# Patient Record
Sex: Female | Born: 1985 | Race: Black or African American | Hispanic: No | Marital: Single | Smoking: Current every day smoker
Health system: Southern US, Community
[De-identification: ages and names within clinical notes are randomized; demographics above are authoritative.]

## PROBLEM LIST (undated history)

## (undated) ENCOUNTER — Inpatient Hospital Stay (HOSPITAL_COMMUNITY): Payer: Self-pay

## (undated) DIAGNOSIS — F329 Major depressive disorder, single episode, unspecified: Secondary | ICD-10-CM

## (undated) DIAGNOSIS — O24419 Gestational diabetes mellitus in pregnancy, unspecified control: Secondary | ICD-10-CM

## (undated) DIAGNOSIS — I10 Essential (primary) hypertension: Secondary | ICD-10-CM

## (undated) DIAGNOSIS — A749 Chlamydial infection, unspecified: Secondary | ICD-10-CM

## (undated) DIAGNOSIS — Z8632 Personal history of gestational diabetes: Secondary | ICD-10-CM

## (undated) DIAGNOSIS — F32A Depression, unspecified: Secondary | ICD-10-CM

## (undated) DIAGNOSIS — D069 Carcinoma in situ of cervix, unspecified: Secondary | ICD-10-CM

## (undated) HISTORY — PX: TONGUE SURGERY: SHX810

## (undated) HISTORY — PX: WISDOM TOOTH EXTRACTION: SHX21

## (undated) HISTORY — DX: Gestational diabetes mellitus in pregnancy, unspecified control: O24.419

## (undated) HISTORY — DX: Personal history of gestational diabetes: Z86.32

## (undated) HISTORY — DX: Carcinoma in situ of cervix, unspecified: D06.9

---
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
## 2007-02-15 ENCOUNTER — Inpatient Hospital Stay (HOSPITAL_COMMUNITY): Admission: AD | Admit: 2007-02-15 | Discharge: 2007-02-15 | Payer: Self-pay | Admitting: Family Medicine

## 2007-10-21 ENCOUNTER — Emergency Department (HOSPITAL_COMMUNITY): Admission: EM | Admit: 2007-10-21 | Discharge: 2007-10-21 | Payer: Self-pay | Admitting: Emergency Medicine

## 2008-01-21 ENCOUNTER — Emergency Department (HOSPITAL_COMMUNITY): Admission: EM | Admit: 2008-01-21 | Discharge: 2008-01-22 | Payer: Self-pay | Admitting: Emergency Medicine

## 2008-01-26 ENCOUNTER — Emergency Department (HOSPITAL_COMMUNITY): Admission: EM | Admit: 2008-01-26 | Discharge: 2008-01-26 | Payer: Self-pay | Admitting: Emergency Medicine

## 2008-12-05 ENCOUNTER — Inpatient Hospital Stay (HOSPITAL_COMMUNITY): Admission: AD | Admit: 2008-12-05 | Discharge: 2008-12-05 | Payer: Self-pay | Admitting: Obstetrics & Gynecology

## 2009-01-02 ENCOUNTER — Ambulatory Visit: Payer: Self-pay | Admitting: Obstetrics & Gynecology

## 2009-06-03 ENCOUNTER — Ambulatory Visit: Payer: Self-pay | Admitting: Obstetrics and Gynecology

## 2009-06-03 LAB — CONVERTED CEMR LAB
HCV Ab: NEGATIVE
Hepatitis B Surface Ag: NEGATIVE

## 2010-08-09 ENCOUNTER — Inpatient Hospital Stay (HOSPITAL_COMMUNITY)
Admission: AD | Admit: 2010-08-09 | Discharge: 2010-08-09 | Payer: Self-pay | Source: Home / Self Care | Attending: Obstetrics & Gynecology | Admitting: Obstetrics & Gynecology

## 2010-08-10 LAB — URINALYSIS, ROUTINE W REFLEX MICROSCOPIC
Bilirubin Urine: NEGATIVE
Leukocytes, UA: NEGATIVE
Nitrite: NEGATIVE
Specific Gravity, Urine: 1.015 (ref 1.005–1.030)
Urobilinogen, UA: 0.2 mg/dL (ref 0.0–1.0)

## 2010-08-10 LAB — URINE MICROSCOPIC-ADD ON

## 2010-08-10 LAB — WET PREP, GENITAL

## 2010-08-10 LAB — POCT PREGNANCY, URINE: Preg Test, Ur: NEGATIVE

## 2010-08-11 LAB — GC/CHLAMYDIA PROBE AMP, GENITAL: Chlamydia, DNA Probe: NEGATIVE

## 2010-10-26 LAB — CBC
HCT: 34.6 % — ABNORMAL LOW (ref 36.0–46.0)
MCV: 81.3 fL (ref 78.0–100.0)
Platelets: 249 10*3/uL (ref 150–400)
RDW: 15.7 % — ABNORMAL HIGH (ref 11.5–15.5)

## 2010-10-26 LAB — WET PREP, GENITAL: Yeast Wet Prep HPF POC: NONE SEEN

## 2010-11-20 ENCOUNTER — Emergency Department (HOSPITAL_COMMUNITY): Payer: Medicaid Other

## 2010-11-20 ENCOUNTER — Emergency Department (HOSPITAL_COMMUNITY)
Admission: EM | Admit: 2010-11-20 | Discharge: 2010-11-20 | Disposition: A | Discharge status: Home / Self Care | Payer: Medicaid Other | Attending: Emergency Medicine | Admitting: Emergency Medicine

## 2010-11-20 DIAGNOSIS — R1110 Vomiting, unspecified: Secondary | ICD-10-CM | POA: Insufficient documentation

## 2010-11-20 DIAGNOSIS — R059 Cough, unspecified: Secondary | ICD-10-CM | POA: Insufficient documentation

## 2010-11-20 DIAGNOSIS — R05 Cough: Secondary | ICD-10-CM | POA: Insufficient documentation

## 2010-11-20 DIAGNOSIS — R062 Wheezing: Secondary | ICD-10-CM | POA: Insufficient documentation

## 2010-11-20 DIAGNOSIS — R0789 Other chest pain: Secondary | ICD-10-CM | POA: Insufficient documentation

## 2010-11-20 DIAGNOSIS — R5381 Other malaise: Secondary | ICD-10-CM | POA: Insufficient documentation

## 2010-11-20 DIAGNOSIS — F17200 Nicotine dependence, unspecified, uncomplicated: Secondary | ICD-10-CM | POA: Insufficient documentation

## 2010-11-20 DIAGNOSIS — R5383 Other fatigue: Secondary | ICD-10-CM | POA: Insufficient documentation

## 2010-11-20 DIAGNOSIS — R070 Pain in throat: Secondary | ICD-10-CM | POA: Insufficient documentation

## 2010-11-20 DIAGNOSIS — J189 Pneumonia, unspecified organism: Secondary | ICD-10-CM | POA: Insufficient documentation

## 2010-11-20 DIAGNOSIS — J3489 Other specified disorders of nose and nasal sinuses: Secondary | ICD-10-CM | POA: Insufficient documentation

## 2010-11-20 LAB — RAPID STREP SCREEN (MED CTR MEBANE ONLY): Streptococcus, Group A Screen (Direct): NEGATIVE

## 2010-11-30 NOTE — Group Therapy Note (Signed)
Breanna Yates, KOT              ACCOUNT NO.:  [id number]   MEDICAL RECORD NO.:  [id number]          PATIENT TYPE:  WOC   LOCATION:  [facility]                   FACILITY:  [facility]   PHYSICIAN:  Dorthula Perfect, MD     DATE OF BIRTH:  1985-09-06   DATE OF SERVICE:  01/02/2009                                  CLINIC NOTE   REASON FOR VISIT:  The patient is wanting to be checked and make sure  her bacterial vaginitis is resolved and she is also requesting a Pap  smear.  According to the MAU record, she is to be rechecked because she  had some abnormal vaginal bleeding.   HISTORY:  This is a G2, P0-0-2-0, menses 13 x 21 x 5, uses condoms for  birth control, and is not interested in another method, but she has  information about contraception.  She would be is okay with getting  pregnant.  Of note, when she was seen in MAU on Dec 05, 2008, she  presented with a history of having 11 days of bleeding, starting on Nov 24, 2008.  She states that this happened one other time in her life,  around 2008 at which time she had a positive chlamydia infection.  When  she was seen in MAU on Dec 05, 2008, she did have some small amount of  vaginal bleeding and her hemoglobin was normal at 11.7 and platelets  249.  She did have a few clue cells on wet prep, and negative GC,  negative chlamydia, negative pregnancy test at that time.  She denies  any particular stress.  She has to one partner now, has had no recent  new partner, but she has had several lifetime partners.  Any  contraception, she has ever been on was oral contraceptives around age  65, and it caused her to bleed heavily.   REVIEW OF HER HISTORY:  She states she has no allergies to any  medications.  She has been taking the Naprosyn, which she was given  following her MAU visit and she only takes that occasionally.   OBSTETRICAL HISTORY:  Significant for 2 very early SABs, no D and C.  She will she has had chlamydia as noted above and  has also had bacterial  vaginosis twice.   FAMILY HISTORY:  Significant for grandparents with diabetes and  hypertension.  She has had no surgeries.   PAST MEDICAL HISTORY:  Negative.   SOCIAL HISTORY:  She does not work outside the home.  She attends [employer].  She is a smoker.  She drinks occasional alcohol.  No illicit drug use,  and at present, though she has used drugs in the past and been abuse in  the past.  She is not being abused now.  She does complain of some  vaginal irritation and itching.  She is now on the last day of her  menses, and denies vaginal discharge.   OBJECTIVE:  VITAL SIGNS:  Temperature 97.5, BP 109/73, weight 198, and  height 5 feet 6 inches.  GENERAL:  She is in no distress.  ABDOMEN:  Soft and nontender.  PELVIC:  NEFG.  BUS negative.  Vagina pink, rugated.  No lesions.  Scant  menstrual blood present.  Wet prep is done per request and is negative  for Trich, yeast, or BV.  Uterus, NSSP.  No adnexal tenderness, or  masses.   ASSESSMENT:  A 25 year old P0-0-2-0 with possible bacterial vaginitis  infection which was treated and it is resolved.  She had a single  episode of menorrhagia without anemia.   PLAN:  Discussed smoking cessation, contraception discussed, and she was  given a handout, she understands that, condoms have poor use  effectiveness rate.  She is advised to keep a menstrual calendar and it  is explained to her that due to her age it is not necessary to do a Pap  smear today.  She would like to come back for Pap smear when she is 23  in September of this year.     ______________________________  Caren Griffins, CNM    ______________________________  Dorthula Perfect, MD    DP/MEDQ  D:  01/02/2009  T:  01/03/2009  Job:  [id number]

## 2011-04-12 LAB — CBC
HCT: 38.5
Hemoglobin: 12.9
WBC: 4.2

## 2011-04-12 LAB — URINALYSIS, ROUTINE W REFLEX MICROSCOPIC
Hgb urine dipstick: NEGATIVE
Protein, ur: NEGATIVE
Urobilinogen, UA: 0.2

## 2011-04-12 LAB — COMPREHENSIVE METABOLIC PANEL
Alkaline Phosphatase: 67
BUN: 8
Chloride: 105
Glucose, Bld: 92
Potassium: 4.2
Total Bilirubin: 0.1 — ABNORMAL LOW

## 2011-04-12 LAB — DIFFERENTIAL
Basophils Absolute: 0
Basophils Relative: 1
Monocytes Absolute: 0.5
Neutro Abs: 1.4 — ABNORMAL LOW
Neutrophils Relative %: 32 — ABNORMAL LOW

## 2011-04-12 LAB — POCT CARDIAC MARKERS: Operator id: 4074

## 2011-05-02 LAB — GC/CHLAMYDIA PROBE AMP, GENITAL: GC Probe Amp, Genital: NEGATIVE

## 2011-05-02 LAB — URINALYSIS, ROUTINE W REFLEX MICROSCOPIC
Glucose, UA: NEGATIVE
Ketones, ur: NEGATIVE
Specific Gravity, Urine: 1.02
pH: 7

## 2011-05-02 LAB — CBC
HCT: 40.4
Hemoglobin: 13.2
MCHC: 32.8
MCV: 81.8 — ABNORMAL LOW
RDW: 14.7 — ABNORMAL HIGH

## 2011-05-02 LAB — POCT PREGNANCY, URINE
Operator id: 120561
Preg Test, Ur: NEGATIVE

## 2011-05-02 LAB — WET PREP, GENITAL
Trich, Wet Prep: NONE SEEN
Yeast Wet Prep HPF POC: NONE SEEN

## 2011-06-20 ENCOUNTER — Other Ambulatory Visit (HOSPITAL_COMMUNITY): Payer: Self-pay | Admitting: Family

## 2011-06-20 DIAGNOSIS — Z369 Encounter for antenatal screening, unspecified: Secondary | ICD-10-CM

## 2011-06-20 LAB — OB RESULTS CONSOLE VARICELLA ZOSTER ANTIBODY, IGG: Varicella: NON-IMMUNE/NOT IMMUNE

## 2011-06-20 LAB — OB RESULTS CONSOLE GBS: GBS: POSITIVE

## 2011-06-20 LAB — OB RESULTS CONSOLE ABO/RH: RH Type: POSITIVE

## 2011-06-20 LAB — OB RESULTS CONSOLE RPR: RPR: NONREACTIVE

## 2011-06-20 LAB — OB RESULTS CONSOLE GC/CHLAMYDIA: Gonorrhea: NEGATIVE

## 2011-07-07 ENCOUNTER — Ambulatory Visit (HOSPITAL_COMMUNITY)
Admission: RE | Admit: 2011-07-07 | Discharge: 2011-07-07 | Disposition: A | Discharge status: Home / Self Care | Payer: Medicaid Other | Source: Ambulatory Visit | Attending: Family | Admitting: Family

## 2011-07-07 ENCOUNTER — Other Ambulatory Visit (HOSPITAL_COMMUNITY): Payer: Self-pay | Admitting: Family

## 2011-07-07 ENCOUNTER — Ambulatory Visit (HOSPITAL_COMMUNITY): Admission: RE | Admit: 2011-07-07 | Payer: Medicaid Other | Source: Ambulatory Visit

## 2011-07-07 DIAGNOSIS — Z369 Encounter for antenatal screening, unspecified: Secondary | ICD-10-CM

## 2011-07-07 DIAGNOSIS — O351XX0 Maternal care for (suspected) chromosomal abnormality in fetus, not applicable or unspecified: Secondary | ICD-10-CM | POA: Insufficient documentation

## 2011-07-07 DIAGNOSIS — O3510X0 Maternal care for (suspected) chromosomal abnormality in fetus, unspecified, not applicable or unspecified: Secondary | ICD-10-CM | POA: Insufficient documentation

## 2011-07-07 DIAGNOSIS — Z3689 Encounter for other specified antenatal screening: Secondary | ICD-10-CM | POA: Insufficient documentation

## 2011-07-15 ENCOUNTER — Encounter (HOSPITAL_COMMUNITY): Payer: Self-pay

## 2011-07-15 ENCOUNTER — Ambulatory Visit (HOSPITAL_COMMUNITY)
Admission: RE | Admit: 2011-07-15 | Discharge: 2011-07-15 | Disposition: A | Discharge status: Home / Self Care | Payer: Medicaid Other | Source: Ambulatory Visit | Attending: Family | Admitting: Family

## 2011-07-15 DIAGNOSIS — Z369 Encounter for antenatal screening, unspecified: Secondary | ICD-10-CM

## 2011-07-15 DIAGNOSIS — O351XX0 Maternal care for (suspected) chromosomal abnormality in fetus, not applicable or unspecified: Secondary | ICD-10-CM | POA: Insufficient documentation

## 2011-07-15 DIAGNOSIS — Z3689 Encounter for other specified antenatal screening: Secondary | ICD-10-CM | POA: Insufficient documentation

## 2011-07-15 DIAGNOSIS — O3510X0 Maternal care for (suspected) chromosomal abnormality in fetus, unspecified, not applicable or unspecified: Secondary | ICD-10-CM | POA: Insufficient documentation

## 2011-07-18 ENCOUNTER — Other Ambulatory Visit (HOSPITAL_COMMUNITY): Payer: Self-pay | Admitting: Physician Assistant

## 2011-07-18 DIAGNOSIS — Z3689 Encounter for other specified antenatal screening: Secondary | ICD-10-CM

## 2011-07-19 NOTE — L&D Delivery Note (Signed)
Delivery Note At 5:25 AM a viable female was delivered via Vaginal, Spontaneous Delivery (Presentation: Right Occiput Anterior).  APGAR: 9, 9; weight .   Placenta status: , .  Cord:  with the following complications: None.  Cord pH: not done  Anesthesia: Epidural  Episiotomy: None Lacerations: 1st degree;Perineal Suture Repair: 2.0 vicryl Est. Blood Loss (mL):   Mom to postpartum.  Baby to nursery-stable.  Rhyse Loux A 01/12/2012, 5:37 AM   Delivery Note At 5:25 AM a viable female was delivered via Vaginal, Spontaneous Delivery (Presentation: Right Occiput Anterior).  APGAR: 9, 9; weight .   Placenta status: , .  Cord:  with the following complications: None.  Cord pH*not done  Anesthesia: Epidural  Episiotomy: None Lacerations: 1st degree;Perineal Suture Repair: 2.0 vicryl Est. Blood Loss (mL):   Mom to postpartum.  Baby to nursery-stable.  Daleiza Bacchi A 01/12/2012, 5:37 AM

## 2011-08-08 ENCOUNTER — Other Ambulatory Visit: Payer: Self-pay

## 2011-08-16 ENCOUNTER — Ambulatory Visit (HOSPITAL_COMMUNITY)
Admission: RE | Admit: 2011-08-16 | Discharge: 2011-08-16 | Disposition: A | Discharge status: Home / Self Care | Payer: Medicaid Other | Source: Ambulatory Visit | Attending: Physician Assistant | Admitting: Physician Assistant

## 2011-08-16 DIAGNOSIS — Z3689 Encounter for other specified antenatal screening: Secondary | ICD-10-CM

## 2011-08-16 DIAGNOSIS — Z1389 Encounter for screening for other disorder: Secondary | ICD-10-CM | POA: Insufficient documentation

## 2011-08-16 DIAGNOSIS — Z363 Encounter for antenatal screening for malformations: Secondary | ICD-10-CM | POA: Insufficient documentation

## 2011-08-16 DIAGNOSIS — O358XX0 Maternal care for other (suspected) fetal abnormality and damage, not applicable or unspecified: Secondary | ICD-10-CM | POA: Insufficient documentation

## 2011-10-26 LAB — OB RESULTS CONSOLE GC/CHLAMYDIA: Chlamydia: NEGATIVE

## 2012-01-08 ENCOUNTER — Inpatient Hospital Stay (HOSPITAL_COMMUNITY)
Admission: AD | Admit: 2012-01-08 | Discharge: 2012-01-08 | Disposition: A | Discharge status: Home / Self Care | Payer: Medicaid Other | Source: Ambulatory Visit | Attending: Obstetrics | Admitting: Obstetrics

## 2012-01-08 ENCOUNTER — Encounter (HOSPITAL_COMMUNITY): Payer: Self-pay | Admitting: *Deleted

## 2012-01-08 DIAGNOSIS — O479 False labor, unspecified: Secondary | ICD-10-CM | POA: Insufficient documentation

## 2012-01-08 NOTE — MAU Note (Signed)
Pt C/O uc's for 2 days, have become worse today.  Pt denies bleeding, discharge or LOF.

## 2012-01-08 NOTE — Discharge Instructions (Signed)

## 2012-01-12 ENCOUNTER — Inpatient Hospital Stay (HOSPITAL_COMMUNITY): Payer: Medicaid Other | Admitting: Anesthesiology

## 2012-01-12 ENCOUNTER — Encounter (HOSPITAL_COMMUNITY): Payer: Self-pay | Admitting: *Deleted

## 2012-01-12 ENCOUNTER — Inpatient Hospital Stay (HOSPITAL_COMMUNITY)
Admission: AD | Admit: 2012-01-12 | Discharge: 2012-01-14 | DRG: 775 | Disposition: A | Discharge status: Home / Self Care | Payer: Medicaid Other | Source: Ambulatory Visit | Attending: Obstetrics | Admitting: Obstetrics

## 2012-01-12 ENCOUNTER — Encounter (HOSPITAL_COMMUNITY): Payer: Self-pay | Admitting: Anesthesiology

## 2012-01-12 ENCOUNTER — Telehealth (HOSPITAL_COMMUNITY): Payer: Self-pay | Admitting: *Deleted

## 2012-01-12 DIAGNOSIS — Z22330 Carrier of Group B streptococcus: Secondary | ICD-10-CM

## 2012-01-12 DIAGNOSIS — O99892 Other specified diseases and conditions complicating childbirth: Secondary | ICD-10-CM | POA: Diagnosis present

## 2012-01-12 HISTORY — DX: Chlamydial infection, unspecified: A74.9

## 2012-01-12 LAB — ABO/RH: ABO/RH(D): O POS

## 2012-01-12 LAB — CBC
HCT: 35.9 % — ABNORMAL LOW (ref 36.0–46.0)
Hemoglobin: 12.1 g/dL (ref 12.0–15.0)
Hemoglobin: 11.9 g/dL — ABNORMAL LOW (ref 12.0–15.0)
MCH: 27.9 pg (ref 26.0–34.0)
MCHC: 33.3 g/dL (ref 30.0–36.0)
RBC: 4.34 MIL/uL (ref 3.87–5.11)
WBC: 15.7 10*3/uL — ABNORMAL HIGH (ref 4.0–10.5)

## 2012-01-12 LAB — COMPREHENSIVE METABOLIC PANEL
ALT: 15 U/L (ref 0–35)
Albumin: 3 g/dL — ABNORMAL LOW (ref 3.5–5.2)
Alkaline Phosphatase: 138 U/L — ABNORMAL HIGH (ref 39–117)
BUN: 8 mg/dL (ref 6–23)
Potassium: 3.9 mEq/L (ref 3.5–5.1)
Sodium: 134 mEq/L — ABNORMAL LOW (ref 135–145)
Total Protein: 6.5 g/dL (ref 6.0–8.3)

## 2012-01-12 LAB — TYPE AND SCREEN
ABO/RH(D): O POS
Antibody Screen: NEGATIVE

## 2012-01-12 LAB — RPR: RPR Ser Ql: NONREACTIVE

## 2012-01-12 MED ORDER — SODIUM BICARBONATE 8.4 % IV SOLN
INTRAVENOUS | Status: DC | PRN
  Administered 2012-01-12: 4 mL via EPIDURAL

## 2012-01-12 MED ORDER — SENNOSIDES-DOCUSATE SODIUM 8.6-50 MG PO TABS
2.0000 | ORAL_TABLET | Freq: Every day | ORAL | Status: DC
  Administered 2012-01-12 – 2012-01-13 (×2): 2 via ORAL

## 2012-01-12 MED ORDER — LIDOCAINE HCL (PF) 1 % IJ SOLN
30.0000 mL | INTRAMUSCULAR | Status: DC | PRN
  Filled 2012-01-12: qty 30

## 2012-01-12 MED ORDER — PRENATAL MULTIVITAMIN CH
1.0000 | ORAL_TABLET | Freq: Every day | ORAL | Status: DC
  Administered 2012-01-12 – 2012-01-14 (×3): 1 via ORAL
  Filled 2012-01-12 (×3): qty 1

## 2012-01-12 MED ORDER — BENZOCAINE-MENTHOL 20-0.5 % EX AERO
1.0000 "application " | INHALATION_SPRAY | CUTANEOUS | Status: DC | PRN
  Administered 2012-01-12: 1 via TOPICAL
  Filled 2012-01-12: qty 56

## 2012-01-12 MED ORDER — OXYTOCIN BOLUS FROM INFUSION
250.0000 mL | Freq: Once | INTRAVENOUS | Status: DC
  Filled 2012-01-12: qty 500

## 2012-01-12 MED ORDER — FERROUS SULFATE 325 (65 FE) MG PO TABS
325.0000 mg | ORAL_TABLET | Freq: Two times a day (BID) | ORAL | Status: DC
  Administered 2012-01-12 – 2012-01-14 (×4): 325 mg via ORAL
  Filled 2012-01-12 (×3): qty 1

## 2012-01-12 MED ORDER — OXYCODONE-ACETAMINOPHEN 5-325 MG PO TABS
1.0000 | ORAL_TABLET | ORAL | Status: DC | PRN
  Administered 2012-01-12 – 2012-01-13 (×6): 1 via ORAL
  Administered 2012-01-14: 2 via ORAL
  Filled 2012-01-12 (×5): qty 1
  Filled 2012-01-12: qty 2

## 2012-01-12 MED ORDER — TETANUS-DIPHTH-ACELL PERTUSSIS 5-2.5-18.5 LF-MCG/0.5 IM SUSP
0.5000 mL | Freq: Once | INTRAMUSCULAR | Status: AC
  Administered 2012-01-13: 0.5 mL via INTRAMUSCULAR
  Filled 2012-01-12: qty 0.5

## 2012-01-12 MED ORDER — SIMETHICONE 80 MG PO CHEW
80.0000 mg | CHEWABLE_TABLET | ORAL | Status: DC | PRN
  Administered 2012-01-12: 80 mg via ORAL

## 2012-01-12 MED ORDER — EPHEDRINE 5 MG/ML INJ: 10.0000 mg | INTRAVENOUS | Status: DC | PRN

## 2012-01-12 MED ORDER — ONDANSETRON HCL 4 MG/2ML IJ SOLN: 4.0000 mg | Freq: Four times a day (QID) | INTRAMUSCULAR | Status: DC | PRN

## 2012-01-12 MED ORDER — DIPHENHYDRAMINE HCL 25 MG PO CAPS: 25.0000 mg | ORAL_CAPSULE | Freq: Four times a day (QID) | ORAL | Status: DC | PRN

## 2012-01-12 MED ORDER — ZOLPIDEM TARTRATE 5 MG PO TABS: 5.0000 mg | ORAL_TABLET | Freq: Every evening | ORAL | Status: DC | PRN

## 2012-01-12 MED ORDER — LANOLIN HYDROUS EX OINT: TOPICAL_OINTMENT | CUTANEOUS | Status: DC | PRN

## 2012-01-12 MED ORDER — MAGNESIUM SULFATE 40 G IN LACTATED RINGERS - SIMPLE
2.0000 g/h | INTRAVENOUS | Status: DC
  Filled 2012-01-12: qty 500

## 2012-01-12 MED ORDER — FENTANYL 2.5 MCG/ML BUPIVACAINE 1/10 % EPIDURAL INFUSION (WH - ANES)
INTRAMUSCULAR | Status: DC | PRN
  Administered 2012-01-12: 14 mL/h via EPIDURAL

## 2012-01-12 MED ORDER — LACTATED RINGERS IV SOLN: 500.0000 mL | Freq: Once | INTRAVENOUS | Status: DC

## 2012-01-12 MED ORDER — EPHEDRINE 5 MG/ML INJ
10.0000 mg | INTRAVENOUS | Status: DC | PRN
  Filled 2012-01-12: qty 4

## 2012-01-12 MED ORDER — LACTATED RINGERS IV SOLN
INTRAVENOUS | Status: DC
  Administered 2012-01-12: 03:00:00 via INTRAVENOUS

## 2012-01-12 MED ORDER — PENICILLIN G POTASSIUM 5000000 UNITS IJ SOLR
2.5000 10*6.[IU] | INTRAVENOUS | Status: DC
  Filled 2012-01-12 (×3): qty 2.5

## 2012-01-12 MED ORDER — PENICILLIN G POTASSIUM 5000000 UNITS IJ SOLR
5.0000 10*6.[IU] | Freq: Once | INTRAVENOUS | Status: AC
  Administered 2012-01-12: 5 10*6.[IU] via INTRAVENOUS
  Filled 2012-01-12: qty 5

## 2012-01-12 MED ORDER — DIBUCAINE 1 % RE OINT: 1.0000 "application " | TOPICAL_OINTMENT | RECTAL | Status: DC | PRN

## 2012-01-12 MED ORDER — CITRIC ACID-SODIUM CITRATE 334-500 MG/5ML PO SOLN
30.0000 mL | ORAL | Status: DC | PRN
Start: 2012-01-12 — End: 2012-01-12

## 2012-01-12 MED ORDER — ONDANSETRON HCL 4 MG/2ML IJ SOLN: 4.0000 mg | INTRAMUSCULAR | Status: DC | PRN

## 2012-01-12 MED ORDER — BUTORPHANOL TARTRATE 2 MG/ML IJ SOLN: 1.0000 mg | INTRAMUSCULAR | Status: DC | PRN

## 2012-01-12 MED ORDER — FENTANYL 2.5 MCG/ML BUPIVACAINE 1/10 % EPIDURAL INFUSION (WH - ANES)
14.0000 mL/h | INTRAMUSCULAR | Status: DC
  Filled 2012-01-12: qty 60

## 2012-01-12 MED ORDER — ACETAMINOPHEN 325 MG PO TABS: 650.0000 mg | ORAL_TABLET | ORAL | Status: DC | PRN

## 2012-01-12 MED ORDER — FLEET ENEMA 7-19 GM/118ML RE ENEM: 1.0000 | ENEMA | RECTAL | Status: DC | PRN

## 2012-01-12 MED ORDER — OXYCODONE-ACETAMINOPHEN 5-325 MG PO TABS
1.0000 | ORAL_TABLET | ORAL | Status: DC | PRN
  Filled 2012-01-12: qty 1

## 2012-01-12 MED ORDER — OXYTOCIN 40 UNITS IN LACTATED RINGERS INFUSION - SIMPLE MED
62.5000 mL/h | Freq: Once | INTRAVENOUS | Status: AC
  Administered 2012-01-12: 62.5 mL/h via INTRAVENOUS
  Filled 2012-01-12: qty 1000

## 2012-01-12 MED ORDER — PHENYLEPHRINE 40 MCG/ML (10ML) SYRINGE FOR IV PUSH (FOR BLOOD PRESSURE SUPPORT): 80.0000 ug | PREFILLED_SYRINGE | INTRAVENOUS | Status: DC | PRN

## 2012-01-12 MED ORDER — MAGNESIUM SULFATE BOLUS VIA INFUSION
4.0000 g | Freq: Once | INTRAVENOUS | Status: DC
  Filled 2012-01-12: qty 500

## 2012-01-12 MED ORDER — DIPHENHYDRAMINE HCL 50 MG/ML IJ SOLN: 12.5000 mg | INTRAMUSCULAR | Status: DC | PRN

## 2012-01-12 MED ORDER — LACTATED RINGERS IV SOLN: 500.0000 mL | INTRAVENOUS | Status: DC | PRN

## 2012-01-12 MED ORDER — IBUPROFEN 600 MG PO TABS
600.0000 mg | ORAL_TABLET | Freq: Four times a day (QID) | ORAL | Status: DC
  Administered 2012-01-12 – 2012-01-14 (×8): 600 mg via ORAL
  Filled 2012-01-12 (×6): qty 1

## 2012-01-12 MED ORDER — ONDANSETRON HCL 4 MG PO TABS: 4.0000 mg | ORAL_TABLET | ORAL | Status: DC | PRN

## 2012-01-12 MED ORDER — WITCH HAZEL-GLYCERIN EX PADS: 1.0000 "application " | MEDICATED_PAD | CUTANEOUS | Status: DC | PRN

## 2012-01-12 MED ORDER — PHENYLEPHRINE 40 MCG/ML (10ML) SYRINGE FOR IV PUSH (FOR BLOOD PRESSURE SUPPORT)
80.0000 ug | PREFILLED_SYRINGE | INTRAVENOUS | Status: DC | PRN
  Filled 2012-01-12: qty 5

## 2012-01-12 MED ORDER — IBUPROFEN 600 MG PO TABS
600.0000 mg | ORAL_TABLET | Freq: Four times a day (QID) | ORAL | Status: DC | PRN
  Filled 2012-01-12 (×2): qty 1

## 2012-01-12 NOTE — Anesthesia Preprocedure Evaluation (Signed)

## 2012-01-12 NOTE — H&P (Signed)
This is Dr. Francoise Ceo dictating the history and physical on  Breanna Yates she's a 26 year old gravida 1 at 70 weeks and 5 days her EDC is 01/14/2012 shows a positive GBS and received penicillin she was admitted in labor cervix 4 cm 100% vertex -1 station she's  Now fully  dilated her membranes ruptured spontaneously at 4:32 AM and she had meconium-stained fluid reactive tracing Past medical history negative Past surgical history negative Family history noncontributory Physical exam revealed a well-developed female in labor HEENT negative Breasts negative Lungs clear Heart regular rhythm no murmurs no gallops Abdomen term Pelvic as described above Extremities negative

## 2012-01-12 NOTE — Anesthesia Postprocedure Evaluation (Signed)
  Anesthesia Post-op Note  Patient: Breanna Yates  Procedure(s) Performed: * No procedures listed *  Patient Location: Mother/Baby  Anesthesia Type: Epidural  Level of Consciousness: awake, alert  and oriented  Airway and Oxygen Therapy: Patient Spontanous Breathing  Post-op Pain: none  Post-op Assessment: Post-op Vital signs reviewed and Patient's Cardiovascular Status Stable  Post-op Vital Signs: Reviewed and stable  Complications: No apparent anesthesia complications

## 2012-01-12 NOTE — Anesthesia Procedure Notes (Signed)

## 2012-01-12 NOTE — Telephone Encounter (Signed)
Preadmission screen  

## 2012-01-12 NOTE — MAU Note (Signed)
Pt G1 at 39.5wks having contractions.

## 2012-01-12 NOTE — Progress Notes (Signed)
Dr Gaynell Face called and updated on BP's. Order to not start mag at this time and to order CMET.

## 2012-01-13 ENCOUNTER — Inpatient Hospital Stay (HOSPITAL_COMMUNITY): Admission: RE | Admit: 2012-01-13 | Payer: Medicaid Other | Source: Ambulatory Visit

## 2012-01-13 LAB — CBC
Hemoglobin: 10.8 g/dL — ABNORMAL LOW (ref 12.0–15.0)
MCV: 83.7 fL (ref 78.0–100.0)
Platelets: 222 10*3/uL (ref 150–400)
RBC: 3.93 MIL/uL (ref 3.87–5.11)
WBC: 10.5 10*3/uL (ref 4.0–10.5)

## 2012-01-13 NOTE — Progress Notes (Signed)
Patient ID: Breanna Yates, female   DOB: 12/09/1985, 26 y.o.   MRN: [id number] Postpartum day one Vital signs normal Fundus firm Lochia moderate Legs negative No complaints

## 2012-01-13 NOTE — Progress Notes (Signed)
UR Chart review completed.  

## 2012-01-14 NOTE — Discharge Summary (Signed)
Obstetric Discharge Summary Reason for Admission: onset of labor Prenatal Procedures: none Intrapartum Procedures: spontaneous vaginal delivery Postpartum Procedures: none Complications-Operative and Postpartum: none Hemoglobin  Date Value Range Status  01/13/2012 10.8* 12.0 - 15.0 g/dL Final     HCT  Date Value Range Status  01/13/2012 32.9* 36.0 - 46.0 % Final    Physical Exam:  General: alert Lochia: appropriate Uterine Fundus: firm Incision: healing well DVT Evaluation: No evidence of DVT seen on physical exam.  Discharge Diagnoses: Term Pregnancy-delivered  Discharge Information: Date: 01/14/2012 Activity: pelvic rest Diet: routine Medications: Percocet Condition: stable Instructions: refer to practice specific booklet Discharge to: home Follow-up Information    Follow up with Iyonnah Ferrante A, MD. Call in 6 weeks.   Contact information:   [address] [place] [address] [phone]          Newborn Data: Live born female  Birth Weight: 6 lb 15.6 oz (3165 g) APGAR: 9, 9  Home with mother.  Hagan Vanauken A 01/14/2012, 6:49 AM

## 2012-06-04 ENCOUNTER — Encounter (HOSPITAL_COMMUNITY): Payer: Self-pay | Admitting: *Deleted

## 2012-06-04 ENCOUNTER — Emergency Department (HOSPITAL_COMMUNITY)
Admission: EM | Admit: 2012-06-04 | Discharge: 2012-06-04 | Disposition: A | Discharge status: Home / Self Care | Payer: Medicaid Other | Attending: Emergency Medicine | Admitting: Emergency Medicine

## 2012-06-04 DIAGNOSIS — R0789 Other chest pain: Secondary | ICD-10-CM

## 2012-06-04 DIAGNOSIS — R071 Chest pain on breathing: Secondary | ICD-10-CM | POA: Insufficient documentation

## 2012-06-04 DIAGNOSIS — Z8619 Personal history of other infectious and parasitic diseases: Secondary | ICD-10-CM | POA: Insufficient documentation

## 2012-06-04 DIAGNOSIS — F17200 Nicotine dependence, unspecified, uncomplicated: Secondary | ICD-10-CM | POA: Insufficient documentation

## 2012-06-04 DIAGNOSIS — K219 Gastro-esophageal reflux disease without esophagitis: Secondary | ICD-10-CM

## 2012-06-04 LAB — CBC WITH DIFFERENTIAL/PLATELET
HCT: 37.7 % (ref 36.0–46.0)
Hemoglobin: 12.4 g/dL (ref 12.0–15.0)
Lymphocytes Relative: 51 % — ABNORMAL HIGH (ref 12–46)
MCHC: 32.9 g/dL (ref 30.0–36.0)
MCV: 80 fL (ref 78.0–100.0)
Monocytes Absolute: 0.5 10*3/uL (ref 0.1–1.0)
Monocytes Relative: 11 % (ref 3–12)
Neutro Abs: 1.5 10*3/uL — ABNORMAL LOW (ref 1.7–7.7)
WBC: 4.2 10*3/uL (ref 4.0–10.5)

## 2012-06-04 LAB — POCT I-STAT, CHEM 8
BUN: 10 mg/dL (ref 6–23)
Calcium, Ion: 1.23 mmol/L (ref 1.12–1.23)
Chloride: 106 mEq/L (ref 96–112)
Glucose, Bld: 89 mg/dL (ref 70–99)

## 2012-06-04 MED ORDER — FAMOTIDINE 20 MG PO TABS: 20.0000 mg | ORAL_TABLET | Freq: Two times a day (BID) | ORAL | Status: DC

## 2012-06-04 MED ORDER — GI COCKTAIL ~~LOC~~
30.0000 mL | Freq: Once | ORAL | Status: AC
  Administered 2012-06-04: 30 mL via ORAL
  Filled 2012-06-04: qty 30

## 2012-06-04 NOTE — ED Notes (Signed)
Pt c/o chest pain from throat to epigastric area x 10 days; denies shortness of breath

## 2012-06-04 NOTE — ED Notes (Signed)
Pt states discomfort has subsided in chest/esophagus.

## 2012-06-04 NOTE — ED Notes (Signed)
Pt states she has had discomfort in chest. "bottom of throat to mid chest"

## 2012-06-04 NOTE — ED Provider Notes (Signed)
Medical screening examination/treatment/procedure(s) were performed by non-physician practitioner and as supervising physician I was immediately available for consultation/collaboration.   Richardean Canal, MD 06/04/12 [phone]

## 2012-06-04 NOTE — ED Provider Notes (Signed)
History     CSN: [id number]  Arrival date & time 06/04/12  0225   First MD Initiated Contact with Patient 06/04/12 0240      Chief Complaint  Patient presents with  . Chest Pain    (Consider location/radiation/quality/duration/timing/severity/associated sxs/prior treatment) HPI Comments: Patient states, that she has had diffuse chest wall pain for the past, week, and a half she has not taken any over-the-counter medication.  Her infant is currently 41 and half months old and weighs 15 pounds and she is the sole care provider.  It hurts when she picks him up as well as when she lays him on her chest.  She thought she may have mastitis and she stopped nursing when he was 46 months old, but she has not had any fever, and her breasts are soft.  She denies any tachycardia or shortness of breath  Patient is a 26 y.o. female presenting with chest pain. The history is provided by the patient.  Chest Pain The chest pain began 1 - 2 weeks ago. Chest pain occurs intermittently. The pain is associated with lifting. At its most intense, the pain is at 5/10. The pain is currently at 4/10. The severity of the pain is mild. Pertinent negatives for primary symptoms include no fever and no dizziness.  Pertinent negatives for associated symptoms include no weakness.     Past Medical History  Diagnosis Date  . Chlamydia 2008    Past Surgical History  Procedure Date  . Wisdom tooth extraction   . Tongue surgery     Family History  Problem Relation Age of Onset  . Hypertension Mother   . Asthma Sister   . Cancer Sister     brain tumor and seizures  . Hypertension Maternal Grandmother   . Diabetes Maternal Grandmother     History  Substance Use Topics  . Smoking status: Current Every Day Smoker -- 0.5 packs/day    Types: Cigarettes  . Smokeless tobacco: Not on file  . Alcohol Use: No    OB History    Grav Para Term Preterm Abortions TAB SAB Ect Mult Living   1 1 1  0 0 0 0 0 0 1       Review of Systems  Constitutional: Negative for fever and chills.  Respiratory: Negative for chest tightness.   Cardiovascular: Positive for chest pain.  Skin: Negative for wound.  Neurological: Negative for dizziness and weakness.    Allergies  Review of patient's allergies indicates no known allergies.  Home Medications   Current Outpatient Rx  Name  Route  Sig  Dispense  Refill  . FAMOTIDINE 20 MG PO TABS   Oral   Take 1 tablet (20 mg total) by mouth 2 (two) times daily.   30 tablet   0     BP 128/79  Pulse 81  Temp 98.1 F (36.7 C) (Oral)  Resp 18  Ht 5\' 7"  (1.702 m)  SpO2 98%  LMP 06/04/2012  Physical Exam  Constitutional: She appears well-developed and well-nourished.  HENT:  Head: Normocephalic.  Eyes: Pupils are equal, round, and reactive to light.  Neck: Normal range of motion.  Cardiovascular: Normal rate and regular rhythm.   Pulmonary/Chest: Effort normal and breath sounds normal. No respiratory distress. She exhibits tenderness.  Abdominal: Soft.  Musculoskeletal: Normal range of motion.  Skin: Skin is warm.    ED Course  Procedures (including critical care time)  Labs Reviewed  CBC WITH DIFFERENTIAL - Abnormal; Notable for  the following:    Neutrophils Relative 35 (*)     Neutro Abs 1.5 (*)     Lymphocytes Relative 51 (*)     All other components within normal limits  POCT I-STAT, CHEM 8   No results found.   1. Chest wall tenderness   2. Gastric reflux     ED ECG REPORT   Date: 06/04/2012  EKG Time: 4:06 AM  Rate: 76  Rhythm: normal sinus rhythm,  there are no previous tracings available for comparison  Axis: normal  Intervals:none  ST&T Change: none  Narrative Interpretation: normal            MDM   Patient has reproducible chest wall pain Patient states she feels, better after receiving a GI cocktail, I will send her home with a prescription for Pepcid, that she can take on a regular basis, and follow up with her  primary care physician       Arman Filter, NP 06/04/12 0406

## 2013-08-22 ENCOUNTER — Other Ambulatory Visit: Payer: Self-pay | Admitting: Obstetrics

## 2013-08-29 ENCOUNTER — Encounter (HOSPITAL_COMMUNITY): Payer: Self-pay

## 2013-09-04 ENCOUNTER — Encounter (HOSPITAL_COMMUNITY): Payer: Self-pay | Admitting: Pharmacist

## 2013-09-05 NOTE — H&P (Signed)
Breanna Yates, WICKENS              ACCOUNT NO.:  [id number]  MEDICAL RECORD NO.:  [id number]  LOCATION:                                 FACILITY:  PHYSICIAN:  Frederico Hamman, M.D.DATE OF BIRTH:  September 05, 1985  DATE OF ADMISSION: DATE OF DISCHARGE:                             HISTORY & PHYSICAL   HISTORY OF PRESENT ILLNESS:  The patient is a 28 year old gravida 1, para 1, who in 2013 had a normal Pap smear.  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  Colposcopy confirmed severe dysplasia and she is now for cervical conization.  PAST MEDICAL HISTORY:  Negative.  PAST SURGICAL HISTORY:  Negative.  SOCIAL HISTORY:  Negative.  REVIEW OF SYSTEMS:  Negative.  PHYSICAL EXAMINATION:  GENERAL:  Revealed a well-developed female, in no distress. HEENT:  Negative. LUNGS:  Clear to P and A. HEART:  Regular rhythm.  No murmurs.  No gallops. BREASTS:  Negative. ABDOMEN:  Negative.  Uterus normal size, negative adnexa.  Cervix, vagina, and external genitalia are normal. EXTREMITIES:  Negative.          ______________________________ Frederico Hamman, M.D.     BAM/MEDQ  D:  09/04/2013  T:  09/04/2013  Job:  [id number]

## 2013-09-18 ENCOUNTER — Encounter (HOSPITAL_COMMUNITY)
Admission: RE | Disposition: A | Discharge status: Home / Self Care | Payer: Self-pay | Source: Ambulatory Visit | Attending: Obstetrics

## 2013-09-18 ENCOUNTER — Ambulatory Visit (HOSPITAL_COMMUNITY): Payer: Medicaid Other | Admitting: Anesthesiology

## 2013-09-18 ENCOUNTER — Ambulatory Visit (HOSPITAL_COMMUNITY)
Admission: RE | Admit: 2013-09-18 | Discharge: 2013-09-18 | Disposition: A | Discharge status: Home / Self Care | Payer: Medicaid Other | Source: Ambulatory Visit | Attending: Obstetrics | Admitting: Obstetrics

## 2013-09-18 ENCOUNTER — Encounter (HOSPITAL_COMMUNITY): Payer: Medicaid Other | Admitting: Anesthesiology

## 2013-09-18 ENCOUNTER — Encounter (HOSPITAL_COMMUNITY): Payer: Self-pay | Admitting: Anesthesiology

## 2013-09-18 DIAGNOSIS — F17200 Nicotine dependence, unspecified, uncomplicated: Secondary | ICD-10-CM | POA: Insufficient documentation

## 2013-09-18 DIAGNOSIS — N879 Dysplasia of cervix uteri, unspecified: Secondary | ICD-10-CM | POA: Insufficient documentation

## 2013-09-18 DIAGNOSIS — D069 Carcinoma in situ of cervix, unspecified: Secondary | ICD-10-CM

## 2013-09-18 HISTORY — PX: CERVICAL CONIZATION W/BX: SHX1330

## 2013-09-18 HISTORY — DX: Carcinoma in situ of cervix, unspecified: D06.9

## 2013-09-18 LAB — CBC
HEMATOCRIT: 34.3 % — AB (ref 36.0–46.0)
Hemoglobin: 11.3 g/dL — ABNORMAL LOW (ref 12.0–15.0)
MCH: 26.4 pg (ref 26.0–34.0)
MCHC: 32.9 g/dL (ref 30.0–36.0)
MCV: 80.1 fL (ref 78.0–100.0)
Platelets: 260 10*3/uL (ref 150–400)
RBC: 4.28 MIL/uL (ref 3.87–5.11)
RDW: 14.8 % (ref 11.5–15.5)
WBC: 6.3 10*3/uL (ref 4.0–10.5)

## 2013-09-18 LAB — PREGNANCY, URINE: Preg Test, Ur: NEGATIVE

## 2013-09-18 SURGERY — CONE BIOPSY, CERVIX
Anesthesia: General | Site: Cervix

## 2013-09-18 MED ORDER — IODINE STRONG (LUGOLS) 5 % PO SOLN
ORAL | Status: AC
  Filled 2013-09-18: qty 1

## 2013-09-18 MED ORDER — PROPOFOL 10 MG/ML IV BOLUS
INTRAVENOUS | Status: DC | PRN
  Administered 2013-09-18: 200 mg via INTRAVENOUS

## 2013-09-18 MED ORDER — METOCLOPRAMIDE HCL 5 MG/ML IJ SOLN: 10.0000 mg | Freq: Once | INTRAMUSCULAR | Status: DC | PRN

## 2013-09-18 MED ORDER — IODINE STRONG (LUGOLS) 5 % PO SOLN
ORAL | Status: DC | PRN
  Administered 2013-09-18: 0.1 mL

## 2013-09-18 MED ORDER — MIDAZOLAM HCL 2 MG/2ML IJ SOLN
INTRAMUSCULAR | Status: AC
  Filled 2013-09-18: qty 2

## 2013-09-18 MED ORDER — KETOROLAC TROMETHAMINE 30 MG/ML IJ SOLN
INTRAMUSCULAR | Status: DC | PRN
  Administered 2013-09-18: 30 mg via INTRAVENOUS

## 2013-09-18 MED ORDER — FERRIC SUBSULFATE SOLN
Status: DC | PRN
  Administered 2013-09-18: 1

## 2013-09-18 MED ORDER — KETOROLAC TROMETHAMINE 30 MG/ML IJ SOLN
INTRAMUSCULAR | Status: AC
  Filled 2013-09-18: qty 1

## 2013-09-18 MED ORDER — LIDOCAINE HCL 1 % IJ SOLN
INTRAMUSCULAR | Status: DC | PRN
  Administered 2013-09-18: 10 mL

## 2013-09-18 MED ORDER — LIDOCAINE HCL (CARDIAC) 20 MG/ML IV SOLN
INTRAVENOUS | Status: AC
  Filled 2013-09-18: qty 5

## 2013-09-18 MED ORDER — DEXAMETHASONE SODIUM PHOSPHATE 10 MG/ML IJ SOLN
INTRAMUSCULAR | Status: AC
  Filled 2013-09-18: qty 1

## 2013-09-18 MED ORDER — ONDANSETRON HCL 4 MG/2ML IJ SOLN
INTRAMUSCULAR | Status: DC | PRN
  Administered 2013-09-18: 4 mg via INTRAVENOUS

## 2013-09-18 MED ORDER — LIDOCAINE HCL 1 % IJ SOLN
INTRAMUSCULAR | Status: AC
  Filled 2013-09-18: qty 20

## 2013-09-18 MED ORDER — KETOROLAC TROMETHAMINE 30 MG/ML IJ SOLN: 15.0000 mg | Freq: Once | INTRAMUSCULAR | Status: DC | PRN

## 2013-09-18 MED ORDER — FENTANYL CITRATE 0.05 MG/ML IJ SOLN: 25.0000 ug | INTRAMUSCULAR | Status: DC | PRN

## 2013-09-18 MED ORDER — DEXAMETHASONE SODIUM PHOSPHATE 10 MG/ML IJ SOLN
INTRAMUSCULAR | Status: DC | PRN
  Administered 2013-09-18: 10 mg via INTRAVENOUS

## 2013-09-18 MED ORDER — MIDAZOLAM HCL 5 MG/5ML IJ SOLN
INTRAMUSCULAR | Status: DC | PRN
  Administered 2013-09-18: 2 mg via INTRAVENOUS

## 2013-09-18 MED ORDER — LACTATED RINGERS IV SOLN
INTRAVENOUS | Status: DC
  Administered 2013-09-18 (×2): via INTRAVENOUS

## 2013-09-18 MED ORDER — ONDANSETRON HCL 4 MG/2ML IJ SOLN
INTRAMUSCULAR | Status: AC
  Filled 2013-09-18: qty 2

## 2013-09-18 MED ORDER — LIDOCAINE HCL (CARDIAC) 20 MG/ML IV SOLN
INTRAVENOUS | Status: DC | PRN
  Administered 2013-09-18: 60 mg via INTRAVENOUS

## 2013-09-18 MED ORDER — MEPERIDINE HCL 25 MG/ML IJ SOLN: 6.2500 mg | INTRAMUSCULAR | Status: DC | PRN

## 2013-09-18 MED ORDER — ACETIC ACID 5 % SOLN
Status: AC
  Filled 2013-09-18: qty 500

## 2013-09-18 MED ORDER — FENTANYL CITRATE 0.05 MG/ML IJ SOLN
INTRAMUSCULAR | Status: DC | PRN
  Administered 2013-09-18 (×2): 50 ug via INTRAVENOUS

## 2013-09-18 MED ORDER — FERRIC SUBSULFATE 259 MG/GM EX SOLN
CUTANEOUS | Status: AC
  Filled 2013-09-18: qty 8

## 2013-09-18 MED ORDER — FENTANYL CITRATE 0.05 MG/ML IJ SOLN
INTRAMUSCULAR | Status: AC
  Filled 2013-09-18: qty 2

## 2013-09-18 MED ORDER — PROPOFOL 10 MG/ML IV EMUL
INTRAVENOUS | Status: AC
  Filled 2013-09-18: qty 20

## 2013-09-18 SURGICAL SUPPLY — 30 items
APPLICATOR COTTON TIP 6IN STRL (MISCELLANEOUS) ×1 IMPLANT
BLADE SURG 11 STRL SS (BLADE) ×2 IMPLANT
CATH ROBINSON RED A/P 16FR (CATHETERS) ×1 IMPLANT
CLOTH BEACON ORANGE TIMEOUT ST (SAFETY) ×2 IMPLANT
CONTAINER PREFILL 10% NBF 60ML (FORM) ×2 IMPLANT
COUNTER NEEDLE 1200 MAGNETIC (NEEDLE) ×1 IMPLANT
DECANTER SPIKE VIAL GLASS SM (MISCELLANEOUS) ×1 IMPLANT
DRSG TELFA 3X8 NADH (GAUZE/BANDAGES/DRESSINGS) ×2 IMPLANT
ELECT REM PT RETURN 9FT ADLT (ELECTROSURGICAL) ×2
ELECTRODE REM PT RTRN 9FT ADLT (ELECTROSURGICAL) IMPLANT
GAUZE SPONGE 4X4 16PLY XRAY LF (GAUZE/BANDAGES/DRESSINGS) ×1 IMPLANT
GLOVE BIO SURGEON STRL SZ8.5 (GLOVE) ×4 IMPLANT
GLOVE BIOGEL PI IND STRL 7.5 (GLOVE) IMPLANT
GLOVE BIOGEL PI INDICATOR 7.5 (GLOVE) ×3
GLOVE ECLIPSE 7.0 STRL STRAW (GLOVE) ×3 IMPLANT
GLOVE SURG SS PI 7.0 STRL IVOR (GLOVE) ×4 IMPLANT
GOWN STRL REUS W/TWL 2XL LVL3 (GOWN DISPOSABLE) ×2 IMPLANT
GOWN STRL REUS W/TWL LRG LVL3 (GOWN DISPOSABLE) ×2 IMPLANT
HOSE NS SMOKE EVAC 7/8 X6 (MISCELLANEOUS) ×1 IMPLANT
NEEDLE SPNL 22GX3.5 QUINCKE BK (NEEDLE) ×2 IMPLANT
NS IRRIG 1000ML POUR BTL (IV SOLUTION) ×2 IMPLANT
PACK VAGINAL MINOR WOMEN LF (CUSTOM PROCEDURE TRAY) ×2 IMPLANT
PAD OB MATERNITY 4.3X12.25 (PERSONAL CARE ITEMS) ×2 IMPLANT
PENCIL BUTTON HOLSTER BLD 10FT (ELECTRODE) ×1 IMPLANT
SCOPETTES 8  STERILE (MISCELLANEOUS) ×2
SCOPETTES 8 STERILE (MISCELLANEOUS) ×2 IMPLANT
SUT CHROMIC 1 CT1 27 (SUTURE) ×6 IMPLANT
SYR CONTROL 10ML LL (SYRINGE) ×2 IMPLANT
TOWEL OR 17X24 6PK STRL BLUE (TOWEL DISPOSABLE) ×4 IMPLANT
WATER STERILE IRR 1000ML POUR (IV SOLUTION) ×2 IMPLANT

## 2013-09-18 NOTE — Anesthesia Postprocedure Evaluation (Signed)
  Anesthesia Post-op Note  Patient: Breanna Yates  Procedure(s) Performed: Procedure(s): CONIZATION CERVIX (N/A) Patient is awake and responsive. Pain and nausea are reasonably well controlled. Vital signs are stable and clinically acceptable. Oxygen saturation is clinically acceptable. There are no apparent anesthetic complications at this time. Patient is ready for discharge.

## 2013-09-18 NOTE — Discharge Instructions (Signed)
DISCHARGE INSTRUCTIONS: CONIZATION The following instructions have been prepared to help you care for yourself upon your return home.  MAY TAKE IBUPROFEN (MOTRIN, ADVIL) OR ALEVE AFTER 5:30 PM FOR PAIN!!!   Personal hygiene:  Use sanitary pads for vaginal drainage, not tampons.  Shower the day after your procedure.  NO tub baths, pools or Jacuzzis for 3 weeks.  Wipe front to back after using the bathroom.  Activity and limitations:  Do NOT drive or operate any equipment for 24 hours. The effects of anesthesia are still present and drowsiness may result.  Do NOT rest in bed all day.  Walking is encouraged.  Walk up and down stairs slowly.  You may resume your normal activity in one to two days or as indicated by your physician.  Sexual activity: NO intercourse for at least 3 weeks after the procedure, as indicated by your physician.  Diet: Eat a light meal as desired this evening. You may resume your usual diet tomorrow.  Return to work: You may resume your work activities in one to  as indicated by your doctor.  What to expect after your surgery: Expect to have vaginal bleeding/discharge for 2-3 days and spotting for up to 10 days. It is not unusual to have soreness for up to 1-2 weeks. You may have a slight burning sensation when you urinate for the first day. Mild cramps may continue for a couple of days. You may have a regular period in 2-6 weeks.  Call your doctor for any of the following:  Excessive vaginal bleeding, saturating and changing one pad every hour.  Inability to urinate 6 hours after discharge from hospital.  Pain not relieved by pain medication.  Fever of 100.4 F or greater.  Unusual vaginal discharge or odor.   Call for an appointment:    Patients signature: ______________________  Nurses signature ________________________  Support person's signature_______________________

## 2013-09-18 NOTE — H&P (Signed)
  There has been no change in the history and physical since the original dictation in in in

## 2013-09-18 NOTE — Transfer of Care (Signed)
Immediate Anesthesia Transfer of Care Note  Patient: Breanna Yates  Procedure(s) Performed: Procedure(s): CONIZATION CERVIX (N/A)  Patient Location: PACU  Anesthesia Type:General  Level of Consciousness: awake  Airway & Oxygen Therapy: Patient Spontanous Breathing and Patient connected to nasal cannula oxygen  Post-op Assessment: Report given to PACU RN and Post -op Vital signs reviewed and stable  Post vital signs: stable  Complications: No apparent anesthesia complications

## 2013-09-18 NOTE — Anesthesia Preprocedure Evaluation (Addendum)
Anesthesia Evaluation  Patient identified by MRN, date of birth, ID band Patient awake    Reviewed: Allergy & Precautions, H&P , NPO status , Patient's Chart, lab work & pertinent test results, reviewed documented beta blocker date and time   History of Anesthesia Complications Negative for: history of anesthetic complications  Airway Mallampati: I TM Distance: >3 FB Neck ROM: full    Dental  (+) Teeth Intact   Pulmonary Current Smoker (1/2 ppd),  breath sounds clear to auscultation        Cardiovascular negative cardio ROS  Rhythm:regular Rate:Normal     Neuro/Psych negative neurological ROS  negative psych ROS   GI/Hepatic negative GI ROS, Neg liver ROS,   Endo/Other  BMI 35.2  Renal/GU negative Renal ROS  Female GU complaint     Musculoskeletal   Abdominal   Peds  Hematology negative hematology ROS (+)   Anesthesia Other Findings   Reproductive/Obstetrics negative OB ROS                          Anesthesia Physical Anesthesia Plan  ASA: II  Anesthesia Plan: General LMA   Post-op Pain Management:    Induction:   Airway Management Planned:   Additional Equipment:   Intra-op Plan:   Post-operative Plan:   Informed Consent: I have reviewed the patients History and Physical, chart, labs and discussed the procedure including the risks, benefits and alternatives for the proposed anesthesia with the patient or authorized representative who has indicated his/her understanding and acceptance.   Dental Advisory Given  Plan Discussed with: CRNA and Surgeon  Anesthesia Plan Comments:         Anesthesia Quick Evaluation

## 2013-09-18 NOTE — Op Note (Signed)
Preop diagnosis severe dysplasia of the cervix Postop diagnosis is same Procedure cervical conization Anesthesia Gen. Under  general anesthesia patient in lithotomy position perineum and vagina prepped and draped  Bladder emptied  with a straight catheter speculum placed in the vagina and hemostatic so 2 abnormal one chromic placed laterally high up on the cervix at 3:00 and 9:00 position: And: Done in the usual manner and hemostasis achieved and he was switched is abnormal one chromic around the cervix the cervical canal was noted to be open blood loss was less than 100 cc patient tolerated the procedure well taken to recovery room in good condition and and and

## 2013-09-19 ENCOUNTER — Encounter (HOSPITAL_COMMUNITY): Payer: Self-pay | Admitting: Obstetrics

## 2014-05-19 ENCOUNTER — Encounter (HOSPITAL_COMMUNITY): Payer: Self-pay | Admitting: Obstetrics

## 2014-11-01 ENCOUNTER — Inpatient Hospital Stay (HOSPITAL_COMMUNITY)
Admission: AD | Admit: 2014-11-01 | Discharge: 2014-11-01 | Disposition: A | Discharge status: Home / Self Care | Payer: Medicaid Other | Source: Ambulatory Visit | Attending: Obstetrics | Admitting: Obstetrics

## 2014-11-01 ENCOUNTER — Encounter (HOSPITAL_COMMUNITY): Payer: Self-pay | Admitting: *Deleted

## 2014-11-01 DIAGNOSIS — O9989 Other specified diseases and conditions complicating pregnancy, childbirth and the puerperium: Secondary | ICD-10-CM | POA: Insufficient documentation

## 2014-11-01 DIAGNOSIS — M542 Cervicalgia: Secondary | ICD-10-CM | POA: Insufficient documentation

## 2014-11-01 DIAGNOSIS — R51 Headache: Secondary | ICD-10-CM | POA: Insufficient documentation

## 2014-11-01 DIAGNOSIS — Z87891 Personal history of nicotine dependence: Secondary | ICD-10-CM | POA: Insufficient documentation

## 2014-11-01 DIAGNOSIS — Z3A18 18 weeks gestation of pregnancy: Secondary | ICD-10-CM | POA: Diagnosis not present

## 2014-11-01 DIAGNOSIS — J029 Acute pharyngitis, unspecified: Secondary | ICD-10-CM | POA: Diagnosis present

## 2014-11-01 LAB — URINALYSIS, ROUTINE W REFLEX MICROSCOPIC
BILIRUBIN URINE: NEGATIVE
GLUCOSE, UA: NEGATIVE mg/dL
Hgb urine dipstick: NEGATIVE
KETONES UR: NEGATIVE mg/dL
LEUKOCYTES UA: NEGATIVE
Nitrite: NEGATIVE
Protein, ur: NEGATIVE mg/dL
Urobilinogen, UA: 0.2 mg/dL (ref 0.0–1.0)
pH: 6 (ref 5.0–8.0)

## 2014-11-01 LAB — RAPID STREP SCREEN (MED CTR MEBANE ONLY): Streptococcus, Group A Screen (Direct): NEGATIVE

## 2014-11-01 LAB — CBC
HCT: 32.1 % — ABNORMAL LOW (ref 36.0–46.0)
Hemoglobin: 10.7 g/dL — ABNORMAL LOW (ref 12.0–15.0)
MCH: 27.1 pg (ref 26.0–34.0)
MCHC: 33.3 g/dL (ref 30.0–36.0)
MCV: 81.3 fL (ref 78.0–100.0)
Platelets: 261 10*3/uL (ref 150–400)
RBC: 3.95 MIL/uL (ref 3.87–5.11)
RDW: 14.8 % (ref 11.5–15.5)
WBC: 19 10*3/uL — ABNORMAL HIGH (ref 4.0–10.5)

## 2014-11-01 MED ORDER — ACETAMINOPHEN 500 MG PO TABS
1000.0000 mg | ORAL_TABLET | ORAL | Status: AC
  Administered 2014-11-01: 1000 mg via ORAL
  Filled 2014-11-01: qty 2

## 2014-11-01 MED ORDER — AMOXICILLIN 500 MG PO CAPS: 500.0000 mg | ORAL_CAPSULE | Freq: Three times a day (TID) | ORAL | Status: DC

## 2014-11-01 NOTE — MAU Note (Signed)
Throat sore last night. This am when awoke felt like needles in back of head and back of neck hurts and hurts to turn neck. Throat still hurts. For last couple days unable to keep down anything. No diarrhea.

## 2014-11-01 NOTE — Discharge Instructions (Signed)

## 2014-11-01 NOTE — Progress Notes (Signed)
Head only hurts when coughs

## 2014-11-01 NOTE — MAU Provider Note (Signed)
History     CSN: [id number]  Arrival date and time: 11/01/14 2107   First Provider Initiated Contact with Patient 11/01/14 2218      Chief Complaint  Patient presents with  . Sore Throat  . Headache  . Neck Pain   HPI Breanna Yates 29 y.o. G2P1001 @[redacted]w[redacted]d  presents to MAU complaining of sore throat x 24 hours and pain in the back of her head since this afternoon.  She feels like her throat is closing and voice is going away.  She has been having more nausea and vomiting the last 2 days than is usual and it hurts to swallow.  She ate a chicken sandwich and a slim jim today but vomited after the sandwich.   She is concerned it is strep throat.  No known contacts.  The headache is in the temples and back of head and neck.  No throbbing.  No photophobia but does have phonophobia.  No fever, CP, SOB, dysuria, abdominal pain, vaginal bleeding.  Endorses good fetal movement.   OB History    Gravida Para Term Preterm AB TAB SAB Ectopic Multiple Living   2 1 1  0 0 0 0 0 0 1      Past Medical History  Diagnosis Date  . Chlamydia 2008    Past Surgical History  Procedure Laterality Date  . Wisdom tooth extraction    . Tongue surgery    . Cervical conization w/bx N/A 09/18/2013    Procedure: CONIZATION CERVIX;  Surgeon: Frederico Hamman, MD;  Location: [facility];  Service: Gynecology;  Laterality: N/A;    Family History  Problem Relation Age of Onset  . Hypertension Mother   . Asthma Sister   . Cancer Sister     brain tumor and seizures  . Hypertension Maternal Grandmother   . Diabetes Maternal Grandmother     History  Substance Use Topics  . Smoking status: Former Smoker -- 0.00 packs/day for 13 years    Types: Cigarettes  . Smokeless tobacco: Not on file  . Alcohol Use: Yes     Comment: occ but not while preg    Allergies:  Allergies  Allergen Reactions  . Pineapple Itching    Oral itching, no swelling    Prescriptions prior to admission  Medication Sig Dispense  Refill Last Dose  . acetaminophen (TYLENOL) 500 MG tablet Take 1,000 mg by mouth every 6 (six) hours as needed.   11/01/2014 at 1300  . guaiFENesin (ROBITUSSIN) 100 MG/5ML liquid Take 100 mg by mouth as needed for cough.   11/01/2014 at Unknown time  . Prenatal Vit-Fe Fumarate-FA (PRENATAL MULTIVITAMIN) TABS tablet Take 1 tablet by mouth daily at 12 noon.   11/01/2014 at 0100    ROS Pertinent ROS in HPI  Physical Exam   Blood pressure 129/64, pulse 102, temperature 98.9 F (37.2 C), resp. rate 18, height 5\' 7"  (1.702 m), weight 224 lb 9.6 oz (101.878 kg), last menstrual period 06/26/2014, SpO2 100 %.  Physical Exam  Constitutional: She is oriented to person, place, and time. She appears well-developed and well-nourished. No distress.  HENT:  Head: Normocephalic and atraumatic.  Mouth/Throat: Oropharynx is clear and moist. No oropharyngeal exudate.  Eyes: EOM are normal.  Neck: Normal range of motion.  Cardiovascular: Normal rate and regular rhythm.   Respiratory: Effort normal and breath sounds normal. No respiratory distress.  GI: Soft. Bowel sounds are normal. She exhibits no distension.  Musculoskeletal: Normal range of motion.  Lymphadenopathy:  She has no cervical adenopathy.  Neurological: She is alert and oriented to person, place, and time.  Skin: Skin is warm and dry.  Psychiatric: She has a normal mood and affect.   Results for orders placed or performed during the hospital encounter of 11/01/14 (from the past 24 hour(s))  Urinalysis, Routine w reflex microscopic     Status: Abnormal   Collection Time: 11/01/14  9:45 PM  Result Value Ref Range   Color, Urine YELLOW YELLOW   APPearance HAZY (A) CLEAR   Specific Gravity, Urine <1.005 (L) 1.005 - 1.030   pH 6.0 5.0 - 8.0   Glucose, UA NEGATIVE NEGATIVE mg/dL   Hgb urine dipstick NEGATIVE NEGATIVE   Bilirubin Urine NEGATIVE NEGATIVE   Ketones, ur NEGATIVE NEGATIVE mg/dL   Protein, ur NEGATIVE NEGATIVE mg/dL    Urobilinogen, UA 0.2 0.0 - 1.0 mg/dL   Nitrite NEGATIVE NEGATIVE   Leukocytes, UA NEGATIVE NEGATIVE  CBC     Status: Abnormal   Collection Time: 11/01/14 10:50 PM  Result Value Ref Range   WBC 19.0 (H) 4.0 - 10.5 K/uL   RBC 3.95 3.87 - 5.11 MIL/uL   Hemoglobin 10.7 (L) 12.0 - 15.0 g/dL   HCT 32.1 (L) 36.0 - 46.0 %   MCV 81.3 78.0 - 100.0 fL   MCH 27.1 26.0 - 34.0 pg   MCHC 33.3 30.0 - 36.0 g/dL   RDW 14.8 11.5 - 15.5 %   Platelets 261 150 - 400 K/uL    MAU Course  Procedures  MDM Discussed with Dr. Jodi Mourning.  Strep culture not yet available.  He advises to begin treatment nonetheless with amoxicillin 500mg  tid x 10 days.  Morrison for discharge  Assessment and Plan  A: Pharyngitis in pregnancy  P: Discharge to home Amoxicillin Tylenol Salt water gargles F/u in clinic Strep test pending Patient may return to MAU as needed or if her condition were to change or worsen   Paticia Stack 11/01/2014, 10:19 PM

## 2014-11-01 NOTE — Progress Notes (Signed)
Allie Dimmer PA in to discuss test results and d/c plan with pt. Written and verbal d/c instructions given and understanding voiced.

## 2014-11-04 ENCOUNTER — Inpatient Hospital Stay (HOSPITAL_COMMUNITY)
Admission: AD | Admit: 2014-11-04 | Discharge: 2014-11-04 | Disposition: A | Discharge status: Home / Self Care | Payer: Medicaid Other | Source: Ambulatory Visit | Attending: Obstetrics | Admitting: Obstetrics

## 2014-11-04 DIAGNOSIS — H109 Unspecified conjunctivitis: Secondary | ICD-10-CM | POA: Diagnosis not present

## 2014-11-04 DIAGNOSIS — Z3A18 18 weeks gestation of pregnancy: Secondary | ICD-10-CM | POA: Diagnosis not present

## 2014-11-04 DIAGNOSIS — O9989 Other specified diseases and conditions complicating pregnancy, childbirth and the puerperium: Secondary | ICD-10-CM | POA: Insufficient documentation

## 2014-11-04 DIAGNOSIS — J0120 Acute ethmoidal sinusitis, unspecified: Secondary | ICD-10-CM

## 2014-11-04 DIAGNOSIS — J019 Acute sinusitis, unspecified: Secondary | ICD-10-CM | POA: Diagnosis not present

## 2014-11-04 LAB — CULTURE, GROUP A STREP: Strep A Culture: NEGATIVE

## 2014-11-04 MED ORDER — POLYMYXIN B-TRIMETHOPRIM 10000-0.1 UNIT/ML-% OP SOLN: 2.0000 [drp] | OPHTHALMIC | Status: DC

## 2014-11-04 MED ORDER — GUAIFENESIN ER 600 MG PO TB12: 600.0000 mg | ORAL_TABLET | Freq: Two times a day (BID) | ORAL | Status: DC

## 2014-11-04 NOTE — Discharge Instructions (Signed)

## 2014-11-04 NOTE — MAU Note (Signed)
Here a couple nights ago, was given antibiotics for a sinus infection.   Every time she takes it, she throws up, or when she eats she throws up.  Seems like the infection has gotten worse.  Thinks she has double pink eye. Both eyes were crusted when she woke up and they itch really bad and are draining.Breanna Yates

## 2015-01-07 ENCOUNTER — Encounter (HOSPITAL_COMMUNITY): Payer: Self-pay

## 2015-01-07 ENCOUNTER — Inpatient Hospital Stay (HOSPITAL_COMMUNITY)
Admission: AD | Admit: 2015-01-07 | Discharge: 2015-01-07 | Disposition: A | Discharge status: Home / Self Care | Payer: Medicaid Other | Source: Ambulatory Visit | Attending: Obstetrics | Admitting: Obstetrics

## 2015-01-07 DIAGNOSIS — Z3A27 27 weeks gestation of pregnancy: Secondary | ICD-10-CM | POA: Insufficient documentation

## 2015-01-07 DIAGNOSIS — O9989 Other specified diseases and conditions complicating pregnancy, childbirth and the puerperium: Secondary | ICD-10-CM | POA: Diagnosis not present

## 2015-01-07 DIAGNOSIS — O26891 Other specified pregnancy related conditions, first trimester: Secondary | ICD-10-CM

## 2015-01-07 DIAGNOSIS — Z87891 Personal history of nicotine dependence: Secondary | ICD-10-CM | POA: Insufficient documentation

## 2015-01-07 DIAGNOSIS — R109 Unspecified abdominal pain: Secondary | ICD-10-CM | POA: Diagnosis not present

## 2015-01-07 DIAGNOSIS — M545 Low back pain: Secondary | ICD-10-CM | POA: Diagnosis not present

## 2015-01-07 DIAGNOSIS — W102XXA Fall (on)(from) incline, initial encounter: Secondary | ICD-10-CM | POA: Diagnosis not present

## 2015-01-07 DIAGNOSIS — W109XXA Fall (on) (from) unspecified stairs and steps, initial encounter: Secondary | ICD-10-CM | POA: Diagnosis not present

## 2015-01-07 NOTE — MAU Note (Signed)
Fell down about 3 or 4 steps landed on knees.

## 2015-01-07 NOTE — Discharge Instructions (Signed)
What Do I Need to Know About Injuries During Pregnancy? °Injuries can happen during pregnancy. Minor falls and accidents usually do not harm you or your baby. However, any injury should be reported to your doctor. °WHAT CAN I DO TO PROTECT MYSELF FROM INJURIES? °· Remove rugs and loose objects on the floor. °· Wear comfortable shoes that have a good grip. Do not wear high-heeled shoes. °· Always wear your seat belt. The lap belt should be below your belly. Always practice safe driving. °· Do not ride on a motorcycle. °· Do not participate in high-impact activities or sports. °· Avoid: °¨ Walking on wet or slippery floors. °¨ Fires. °¨ Starting fires. °¨ Lifting heavy pots of boiling or hot liquids. °¨ Fixing electrical problems. °· Only take medicine as told by your doctor. °· Know your blood type and the blood type of the baby's father. °· Call your local emergency services (911 in the [place] if you are a victim of domestic violence or assault. For help and support, contact the National Domestic Violence Hotline. °WHEN SHOULD I GET HELP RIGHT AWAY? °· You fall on your belly or have any high-impact accident or injury. °· You have been a victim of domestic violence or any kind of violence. °· You have been in a car accident. °· You have bleeding from your vagina. °· Fluid is leaking from your vagina. °· You start to have belly cramping (contractions) or pain. °· You feel weak or pass out (faint). °· You start to throw up (vomit) after an injury. °· You have been burned. °· You have a stiff neck or neck pain. °· You get a headache or have vision problems after an injury. °· You do not feel the baby move or the baby is not moving as much as normal. °Document Released: 08/06/2010 Document Revised: 11/18/2013 Document Reviewed: 04/10/2013 °ExitCare® Patient Information ©2015 [employer]. This information is not intended to replace advice given to you by your health care provider. Make sure you discuss any questions you  have with your health care provider. ° °

## 2015-01-07 NOTE — MAU Note (Signed)
Some back pain and abdominal pain 4/10, denies vaginal bleeding.

## 2015-01-07 NOTE — MAU Provider Note (Signed)
History     CSN: [id number]  Arrival date and time: 01/07/15 6010   First Provider Initiated Contact with Patient 01/07/15 1940      Chief Complaint  Patient presents with  . Fall   HPI Comments: Breanna Yates is a 29 y.o. G2P1001 at [redacted]w[redacted]d presents after falling forward down 3 or 4 stairs and landed on her hands and knees. Did not directly strike abdomen, but began  abdominal and low back pain shortly after the fall. The pain is constant but waxes and wanes. Location is bilateral lower abdomen and fundal. No other pain or injury. No vaginal bleeding or leaking. Good fetal movement. Pregnancy course has been essentially uncomplicated.   Fall Associated symptoms include abdominal pain. Pertinent negatives include no fever, headaches, hematuria, nausea or vomiting.  Abdominal Pain This is a new problem. The current episode started today. The onset quality is sudden. The problem occurs intermittently. The problem has been unchanged. The pain is located in the LLQ and RUQ. The pain is at a severity of 4/10. The pain is mild. The quality of the pain is aching. The abdominal pain does not radiate (Also mid LBP). Pertinent negatives include no dysuria, fever, headaches, hematuria, myalgias, nausea or vomiting. Nothing aggravates the pain. The pain is relieved by nothing. She has tried nothing for the symptoms. Her past medical history is significant for gallstones. There is no history of abdominal surgery, GERD or irritable bowel syndrome.      Past Medical History  Diagnosis Date  . Chlamydia 2008    Past Surgical History  Procedure Laterality Date  . Wisdom tooth extraction    . Tongue surgery    . Cervical conization w/bx N/A 09/18/2013    Procedure: CONIZATION CERVIX;  Surgeon: Frederico Hamman, MD;  Location: [facility];  Service: Gynecology;  Laterality: N/A;    Family History  Problem Relation Age of Onset  . Hypertension Mother   . Asthma Sister   . Cancer Sister     brain  tumor and seizures  . Hypertension Maternal Grandmother   . Diabetes Maternal Grandmother     History  Substance Use Topics  . Smoking status: Former Smoker -- 0.00 packs/day for 13 years    Types: Cigarettes  . Smokeless tobacco: Not on file  . Alcohol Use: Yes     Comment: occ but not while preg    Allergies:  Allergies  Allergen Reactions  . Pineapple Itching    Oral itching, no swelling  . Mango Flavor Rash    Caused rash on face.    Prescriptions prior to admission  Medication Sig Dispense Refill Last Dose  . Prenatal Vit-Fe Fumarate-FA (PRENATAL MULTIVITAMIN) TABS tablet Take 1 tablet by mouth daily at 12 noon.   01/07/2015 at Unknown time  . amoxicillin (AMOXIL) 500 MG capsule Take 1 capsule (500 mg total) by mouth 3 (three) times daily. (Patient not taking: Reported on 01/07/2015) 30 capsule 0 Not Taking at Unknown time  . guaiFENesin (MUCINEX) 600 MG 12 hr tablet Take 1 tablet (600 mg total) by mouth 2 (two) times daily. (Patient not taking: Reported on 01/07/2015) 30 tablet 0 Not Taking at Unknown time  . trimethoprim-polymyxin b (POLYTRIM) ophthalmic solution Place 2 drops into both eyes every 4 (four) hours. (Patient not taking: Reported on 01/07/2015) 10 mL 0 Not Taking at Unknown time    Review of Systems  Constitutional: Negative for fever.  Gastrointestinal: Positive for abdominal pain. Negative for nausea and  vomiting.  Genitourinary: Negative for dysuria, urgency and hematuria.  Musculoskeletal: Positive for back pain and falls. Negative for myalgias, joint pain and neck pain.  Neurological: Negative for dizziness and headaches.  Endo/Heme/Allergies: Does not bruise/bleed easily.   Physical Exam   Blood pressure 116/76, temperature 98.2 F (36.8 C), temperature source Oral, last menstrual period 06/26/2014.  Physical Exam  Nursing note and vitals reviewed. Constitutional: She is oriented to person, place, and time. She appears well-developed and  well-nourished. No distress.  HENT:  Head: Normocephalic.  Eyes: Pupils are equal, round, and reactive to light.  Neck: Normal range of motion.  Cardiovascular: Normal rate.   Respiratory: Effort normal.  GI: Soft. There is tenderness.  S=D; minimal L and R groin TTP  Genitourinary:  Neg CVAT  Musculoskeletal: Normal range of motion. She exhibits no tenderness.  Neurological: She is alert and oriented to person, place, and time.  Skin: Skin is warm and dry.  No skin marks on hands or knees.  Psychiatric: She has a normal mood and affect. Her behavior is normal. Judgment and thought content normal.   EFM: Baseline 140-145, moderate variability, accelerations present, no decelerations Toco: No contractions  MAU Course  Procedures No results found for this or any previous visit (from the past 24 hour(s)).   C/W Dr. Ruthann Cancer Monitor for 2 hours predischarge.  Care assumed by Kerry Hough, PA at 2030  Assessment and Plan  G2P1001 at [redacted]w[redacted]d Category 1 FHR 1. Fall (on)(from) incline, initial encounter      POE,DEIRDRE 01/07/2015, 7:41 PM   MDM Care assumed from Candis Musa, CNM Patient needs EFM until 2112 per Dr. Ruthann Cancer Reactive NST. No contractions. Patient monitored x 2 hours as advised.   A: SIUP at [redacted]w[redacted]d Fall  P: Discharge home Tylenol PRN for pain Bleeding precautions discussed Patient advised to follow-up with Dr. Ruthann Cancer as scheduled for routine prenatal care or sooner if symptoms worsen Patient may return to MAU as needed or if her condition were to change or worsen  Luvenia Redden, PA-C 01/07/2015 8:33 PM

## 2015-02-05 ENCOUNTER — Inpatient Hospital Stay (HOSPITAL_COMMUNITY)
Admission: AD | Admit: 2015-02-05 | Discharge: 2015-02-06 | DRG: 781 | Disposition: A | Discharge status: Home / Self Care | Payer: Medicaid Other | Source: Ambulatory Visit | Attending: Obstetrics | Admitting: Obstetrics

## 2015-02-05 ENCOUNTER — Encounter (HOSPITAL_COMMUNITY): Payer: Self-pay | Admitting: *Deleted

## 2015-02-05 DIAGNOSIS — Z833 Family history of diabetes mellitus: Secondary | ICD-10-CM

## 2015-02-05 DIAGNOSIS — Z3A32 32 weeks gestation of pregnancy: Secondary | ICD-10-CM | POA: Diagnosis present

## 2015-02-05 DIAGNOSIS — O9989 Other specified diseases and conditions complicating pregnancy, childbirth and the puerperium: Principal | ICD-10-CM | POA: Diagnosis present

## 2015-02-05 DIAGNOSIS — Z8249 Family history of ischemic heart disease and other diseases of the circulatory system: Secondary | ICD-10-CM | POA: Diagnosis not present

## 2015-02-05 DIAGNOSIS — Z87891 Personal history of nicotine dependence: Secondary | ICD-10-CM

## 2015-02-05 DIAGNOSIS — O42913 Preterm premature rupture of membranes, unspecified as to length of time between rupture and onset of labor, third trimester: Secondary | ICD-10-CM

## 2015-02-05 LAB — URINALYSIS, ROUTINE W REFLEX MICROSCOPIC
BILIRUBIN URINE: NEGATIVE
GLUCOSE, UA: NEGATIVE mg/dL
Hgb urine dipstick: NEGATIVE
Ketones, ur: NEGATIVE mg/dL
Leukocytes, UA: NEGATIVE
Nitrite: NEGATIVE
Protein, ur: NEGATIVE mg/dL
SPECIFIC GRAVITY, URINE: 1.015 (ref 1.005–1.030)
Urobilinogen, UA: 0.2 mg/dL (ref 0.0–1.0)
pH: 6 (ref 5.0–8.0)

## 2015-02-05 LAB — TYPE AND SCREEN
ABO/RH(D): O POS
Antibody Screen: NEGATIVE

## 2015-02-05 LAB — POCT FERN TEST: POCT FERN TEST: POSITIVE

## 2015-02-05 MED ORDER — ZOLPIDEM TARTRATE 5 MG PO TABS: 5.0000 mg | ORAL_TABLET | Freq: Every evening | ORAL | Status: DC | PRN

## 2015-02-05 MED ORDER — DOCUSATE SODIUM 100 MG PO CAPS: 100.0000 mg | ORAL_CAPSULE | Freq: Every day | ORAL | Status: DC

## 2015-02-05 MED ORDER — ACETAMINOPHEN 325 MG PO TABS: 650.0000 mg | ORAL_TABLET | ORAL | Status: DC | PRN

## 2015-02-05 MED ORDER — BETAMETHASONE SOD PHOS & ACET 6 (3-3) MG/ML IJ SUSP
12.0000 mg | INTRAMUSCULAR | Status: DC
  Administered 2015-02-05: 12 mg via INTRAMUSCULAR
  Filled 2015-02-05: qty 2

## 2015-02-05 MED ORDER — PRENATAL MULTIVITAMIN CH: 1.0000 | ORAL_TABLET | Freq: Every day | ORAL | Status: DC

## 2015-02-05 MED ORDER — LACTATED RINGERS IV SOLN
INTRAVENOUS | Status: DC
  Administered 2015-02-05: 22:00:00 via INTRAVENOUS

## 2015-02-05 MED ORDER — AZITHROMYCIN 250 MG PO TABS
500.0000 mg | ORAL_TABLET | Freq: Every day | ORAL | Status: DC
  Administered 2015-02-05: 500 mg via ORAL
  Filled 2015-02-05: qty 2

## 2015-02-05 MED ORDER — CALCIUM CARBONATE ANTACID 500 MG PO CHEW: 2.0000 | CHEWABLE_TABLET | ORAL | Status: DC | PRN

## 2015-02-05 MED ORDER — SODIUM CHLORIDE 0.9 % IV SOLN
3.0000 g | Freq: Four times a day (QID) | INTRAVENOUS | Status: DC
  Administered 2015-02-05: 3 g via INTRAVENOUS
  Filled 2015-02-05 (×4): qty 3

## 2015-02-05 NOTE — MAU Provider Note (Signed)
  History     CSN: [id number]  Arrival date and time: 02/05/15 4098   First Provider Initiated Contact with Patient 02/05/15 1920      Chief Complaint  Patient presents with  . Rupture of Membranes   HPI Pt is G2P1 at 32weeks 0d GA C/o of leakage of fluid about 6 pm today- clothes and seat of car wet- filled a depends No ctx, spotting or discharge  Past Medical History  Diagnosis Date  . Chlamydia 2008    Past Surgical History  Procedure Laterality Date  . Wisdom tooth extraction    . Tongue surgery    . Cervical conization w/bx N/A 09/18/2013    Procedure: CONIZATION CERVIX;  Surgeon: Frederico Hamman, MD;  Location: [facility];  Service: Gynecology;  Laterality: N/A;    Family History  Problem Relation Age of Onset  . Hypertension Mother   . Asthma Sister   . Cancer Sister     brain tumor and seizures  . Hypertension Maternal Grandmother   . Diabetes Maternal Grandmother     History  Substance Use Topics  . Smoking status: Former Smoker -- 0.00 packs/day for 13 years    Types: Cigarettes  . Smokeless tobacco: Not on file  . Alcohol Use: Yes     Comment: occ but not while preg    Allergies:  Allergies  Allergen Reactions  . Pineapple Itching    Oral itching, no swelling  . Mango Flavor Rash    Caused rash on face.    Prescriptions prior to admission  Medication Sig Dispense Refill Last Dose  . Prenatal Vit-Fe Fumarate-FA (PRENATAL MULTIVITAMIN) TABS tablet Take 1 tablet by mouth daily at 12 noon.   01/07/2015 at Unknown time    Review of Systems  Constitutional: Negative for fever and chills.  Gastrointestinal: Negative for nausea, vomiting, abdominal pain, diarrhea and constipation.  Genitourinary: Negative for dysuria and urgency.  Neurological: Negative for headaches.   Physical Exam   Blood pressure 133/63, pulse 93, temperature 98.2 F (36.8 C), resp. rate 18, height 5\' 7"  (1.702 m), weight 230 lb 3.2 oz (104.418 kg), last menstrual period  06/26/2014.  Physical Exam  Nursing note and vitals reviewed. Constitutional: She is oriented to person, place, and time. She appears well-developed and well-nourished. No distress.  HENT:  Head: Normocephalic.  Eyes: Pupils are equal, round, and reactive to light.  Neck: Normal range of motion. Neck supple.  Cardiovascular: Normal rate.   Respiratory: Effort normal.  GI: Soft. She exhibits no distension. There is no tenderness. There is no rebound.  No ctx noted on toco or palpation FHR 152 bpm with doppler  Genitourinary:  Speculum exam revealed large amount of clear fluid pooling in vagina- positive FERN RN cervical exam 3-3.5cm  Musculoskeletal: Normal range of motion.  Neurological: She is alert and oriented to person, place, and time.  Skin: Skin is warm and dry.  Psychiatric: She has a normal mood and affect.    MAU Course  Procedures +ROM at 32weeks RN to report to Dr. Ruthann Cancer    Assessment and Plan    Breanna Yates 02/05/2015, 7:22 PM

## 2015-02-05 NOTE — MAU Note (Signed)
Pt stated she has had leaking of fluid several times today. C/o some pain in her back but not contractions.

## 2015-07-19 NOTE — L&D Delivery Note (Signed)
Delivery Note   Breanna Yates, Breanna Yates [id number]  At 7:58 AM a viable and healthy female was delivered via Vaginal, Spontaneous Delivery (Presentation: Left Occiput Anterior).  APGAR: 6, 8; weight 3 lb 7.4 oz (1570 g).   Placenta status: Intact, Spontaneous.  Cord: 3 vessels with the following complications: None.  Anesthesia: None  Episiotomy: None Lacerations: None Suture Repair: none Est. Blood Loss (mL): 350    [name], [name]  At 8:00 AM a viable female was delivered via Vaginal, Spontaneous Delivery (Presentation: [place] ).  APGAR: 6, 8; weight 3 lb 2.1 oz (1420 g).   Placenta status: Intact, Spontaneous.  Cord: 3 vessels with the following complications: None.  Anesthesia: None  Episiotomy: None Lacerations: None Suture Repair: none Est. Blood Loss (mL): 350   Mom to postpartum floor.  Baby A to NICU.  For Prematurity Baby B to NICU. For Prematurity  [name],[name] 12/05/2015, 8:58 AM

## 2015-07-24 ENCOUNTER — Other Ambulatory Visit (HOSPITAL_COMMUNITY): Payer: Self-pay | Admitting: Obstetrics

## 2015-07-24 DIAGNOSIS — Z3401 Encounter for supervision of normal first pregnancy, first trimester: Secondary | ICD-10-CM

## 2015-07-24 DIAGNOSIS — Z3689 Encounter for other specified antenatal screening: Secondary | ICD-10-CM

## 2015-07-29 ENCOUNTER — Other Ambulatory Visit (HOSPITAL_COMMUNITY): Payer: Self-pay | Admitting: Obstetrics

## 2015-07-29 ENCOUNTER — Ambulatory Visit (HOSPITAL_COMMUNITY)
Admission: RE | Admit: 2015-07-29 | Discharge: 2015-07-29 | Disposition: A | Discharge status: Home / Self Care | Payer: Medicaid Other | Source: Ambulatory Visit | Attending: Obstetrics | Admitting: Obstetrics

## 2015-07-29 DIAGNOSIS — O26849 Uterine size-date discrepancy, unspecified trimester: Secondary | ICD-10-CM

## 2015-07-29 DIAGNOSIS — Z3687 Encounter for antenatal screening for uncertain dates: Secondary | ICD-10-CM

## 2015-07-29 DIAGNOSIS — O30001 Twin pregnancy, unspecified number of placenta and unspecified number of amniotic sacs, first trimester: Secondary | ICD-10-CM | POA: Diagnosis not present

## 2015-07-29 DIAGNOSIS — Z36 Encounter for antenatal screening of mother: Secondary | ICD-10-CM | POA: Diagnosis not present

## 2015-07-29 DIAGNOSIS — Z3A12 12 weeks gestation of pregnancy: Secondary | ICD-10-CM | POA: Insufficient documentation

## 2015-09-03 ENCOUNTER — Other Ambulatory Visit (HOSPITAL_COMMUNITY): Payer: Self-pay | Admitting: Obstetrics

## 2015-09-03 DIAGNOSIS — Z3689 Encounter for other specified antenatal screening: Secondary | ICD-10-CM

## 2015-09-03 DIAGNOSIS — Z3A18 18 weeks gestation of pregnancy: Secondary | ICD-10-CM

## 2015-09-03 DIAGNOSIS — IMO0001 Reserved for inherently not codable concepts without codable children: Secondary | ICD-10-CM

## 2015-09-06 ENCOUNTER — Encounter (HOSPITAL_COMMUNITY): Payer: Self-pay | Admitting: *Deleted

## 2015-09-09 ENCOUNTER — Encounter (HOSPITAL_COMMUNITY): Payer: Self-pay

## 2015-09-09 ENCOUNTER — Other Ambulatory Visit (HOSPITAL_COMMUNITY): Payer: Self-pay | Admitting: Obstetrics

## 2015-09-09 ENCOUNTER — Ambulatory Visit (HOSPITAL_COMMUNITY)
Admission: RE | Admit: 2015-09-09 | Discharge: 2015-09-09 | Disposition: A | Discharge status: Home / Self Care | Payer: Medicaid Other | Source: Ambulatory Visit | Attending: Obstetrics | Admitting: Obstetrics

## 2015-09-09 DIAGNOSIS — Z3A18 18 weeks gestation of pregnancy: Secondary | ICD-10-CM | POA: Insufficient documentation

## 2015-09-09 DIAGNOSIS — O99212 Obesity complicating pregnancy, second trimester: Secondary | ICD-10-CM | POA: Diagnosis not present

## 2015-09-09 DIAGNOSIS — O09892 Supervision of other high risk pregnancies, second trimester: Secondary | ICD-10-CM

## 2015-09-09 DIAGNOSIS — O09212 Supervision of pregnancy with history of pre-term labor, second trimester: Secondary | ICD-10-CM | POA: Insufficient documentation

## 2015-09-09 DIAGNOSIS — O30032 Twin pregnancy, monochorionic/diamniotic, second trimester: Secondary | ICD-10-CM | POA: Insufficient documentation

## 2015-09-09 DIAGNOSIS — IMO0001 Reserved for inherently not codable concepts without codable children: Secondary | ICD-10-CM

## 2015-09-09 DIAGNOSIS — Z3689 Encounter for other specified antenatal screening: Secondary | ICD-10-CM

## 2015-09-09 DIAGNOSIS — Z36 Encounter for antenatal screening of mother: Secondary | ICD-10-CM | POA: Insufficient documentation

## 2015-09-10 ENCOUNTER — Other Ambulatory Visit (HOSPITAL_COMMUNITY): Payer: Self-pay | Admitting: *Deleted

## 2015-09-10 DIAGNOSIS — O30039 Twin pregnancy, monochorionic/diamniotic, unspecified trimester: Secondary | ICD-10-CM

## 2015-09-16 LAB — OB RESULTS CONSOLE RPR: RPR: NONREACTIVE

## 2015-09-16 LAB — OB RESULTS CONSOLE GC/CHLAMYDIA
CHLAMYDIA, DNA PROBE: NEGATIVE
Gonorrhea: NEGATIVE

## 2015-09-16 LAB — OB RESULTS CONSOLE HIV ANTIBODY (ROUTINE TESTING): HIV: NONREACTIVE

## 2015-09-23 ENCOUNTER — Encounter (HOSPITAL_COMMUNITY): Payer: Self-pay

## 2015-09-23 ENCOUNTER — Ambulatory Visit (HOSPITAL_COMMUNITY)
Admission: RE | Admit: 2015-09-23 | Discharge: 2015-09-23 | Disposition: A | Discharge status: Home / Self Care | Payer: Medicaid Other | Source: Ambulatory Visit | Attending: Obstetrics | Admitting: Obstetrics

## 2015-09-23 ENCOUNTER — Other Ambulatory Visit (HOSPITAL_COMMUNITY): Payer: Self-pay | Admitting: Maternal and Fetal Medicine

## 2015-09-23 DIAGNOSIS — O3442 Maternal care for other abnormalities of cervix, second trimester: Secondary | ICD-10-CM | POA: Insufficient documentation

## 2015-09-23 DIAGNOSIS — O09212 Supervision of pregnancy with history of pre-term labor, second trimester: Secondary | ICD-10-CM | POA: Insufficient documentation

## 2015-09-23 DIAGNOSIS — O30039 Twin pregnancy, monochorionic/diamniotic, unspecified trimester: Secondary | ICD-10-CM

## 2015-09-23 DIAGNOSIS — O99212 Obesity complicating pregnancy, second trimester: Secondary | ICD-10-CM | POA: Insufficient documentation

## 2015-09-23 DIAGNOSIS — Z3A20 20 weeks gestation of pregnancy: Secondary | ICD-10-CM | POA: Insufficient documentation

## 2015-09-23 DIAGNOSIS — O30032 Twin pregnancy, monochorionic/diamniotic, second trimester: Secondary | ICD-10-CM | POA: Insufficient documentation

## 2015-10-07 ENCOUNTER — Encounter (HOSPITAL_COMMUNITY): Payer: Self-pay

## 2015-10-07 ENCOUNTER — Ambulatory Visit (HOSPITAL_COMMUNITY)
Admission: RE | Admit: 2015-10-07 | Discharge: 2015-10-07 | Disposition: A | Discharge status: Home / Self Care | Payer: Medicaid Other | Source: Ambulatory Visit | Attending: Obstetrics | Admitting: Obstetrics

## 2015-10-07 DIAGNOSIS — O99212 Obesity complicating pregnancy, second trimester: Secondary | ICD-10-CM | POA: Diagnosis not present

## 2015-10-07 DIAGNOSIS — O30039 Twin pregnancy, monochorionic/diamniotic, unspecified trimester: Secondary | ICD-10-CM

## 2015-10-07 DIAGNOSIS — Z3A22 22 weeks gestation of pregnancy: Secondary | ICD-10-CM | POA: Diagnosis not present

## 2015-10-07 DIAGNOSIS — O09212 Supervision of pregnancy with history of pre-term labor, second trimester: Secondary | ICD-10-CM | POA: Insufficient documentation

## 2015-10-07 DIAGNOSIS — O30032 Twin pregnancy, monochorionic/diamniotic, second trimester: Secondary | ICD-10-CM | POA: Insufficient documentation

## 2015-10-16 ENCOUNTER — Inpatient Hospital Stay (HOSPITAL_COMMUNITY)
Admission: AD | Admit: 2015-10-16 | Discharge: 2015-10-16 | Disposition: A | Discharge status: Home / Self Care | Payer: Medicaid Other | Source: Ambulatory Visit | Attending: Obstetrics | Admitting: Obstetrics

## 2015-10-16 ENCOUNTER — Encounter (HOSPITAL_COMMUNITY): Payer: Self-pay | Admitting: *Deleted

## 2015-10-16 DIAGNOSIS — O30032 Twin pregnancy, monochorionic/diamniotic, second trimester: Secondary | ICD-10-CM | POA: Insufficient documentation

## 2015-10-16 DIAGNOSIS — Z87891 Personal history of nicotine dependence: Secondary | ICD-10-CM | POA: Insufficient documentation

## 2015-10-16 DIAGNOSIS — Z3A24 24 weeks gestation of pregnancy: Secondary | ICD-10-CM

## 2015-10-16 LAB — URINALYSIS, ROUTINE W REFLEX MICROSCOPIC
BILIRUBIN URINE: NEGATIVE
Glucose, UA: NEGATIVE mg/dL
Hgb urine dipstick: NEGATIVE
Ketones, ur: NEGATIVE mg/dL
NITRITE: NEGATIVE
PROTEIN: NEGATIVE mg/dL
SPECIFIC GRAVITY, URINE: 1.02 (ref 1.005–1.030)
pH: 7 (ref 5.0–8.0)

## 2015-10-16 LAB — URINE MICROSCOPIC-ADD ON: RBC / HPF: NONE SEEN RBC/hpf (ref 0–5)

## 2015-10-16 LAB — FETAL FIBRONECTIN: FETAL FIBRONECTIN: POSITIVE — AB

## 2015-10-16 LAB — WET PREP, GENITAL
CLUE CELLS WET PREP: NONE SEEN
SPERM: NONE SEEN
Trich, Wet Prep: NONE SEEN

## 2015-10-16 MED ORDER — NIFEDIPINE 10 MG PO CAPS
10.0000 mg | ORAL_CAPSULE | Freq: Once | ORAL | Status: AC
  Administered 2015-10-16: 10 mg via ORAL
  Filled 2015-10-16: qty 1

## 2015-10-16 MED ORDER — MAGNESIUM SULFATE 50 % IJ SOLN
2.0000 g/h | INTRAVENOUS | Status: DC
  Administered 2015-10-16: 2 g/h via INTRAVENOUS
  Filled 2015-10-16: qty 80

## 2015-10-16 MED ORDER — SODIUM CHLORIDE 0.9 % IV SOLN
INTRAVENOUS | Status: DC
  Administered 2015-10-16: 16:00:00 via INTRAVENOUS

## 2015-10-16 MED ORDER — MAGNESIUM SULFATE BOLUS VIA INFUSION
4.0000 g | Freq: Once | INTRAVENOUS | Status: AC
  Administered 2015-10-16: 4 g via INTRAVENOUS
  Filled 2015-10-16: qty 500

## 2015-10-16 MED ORDER — BETAMETHASONE SOD PHOS & ACET 6 (3-3) MG/ML IJ SUSP
12.0000 mg | Freq: Once | INTRAMUSCULAR | Status: AC
  Administered 2015-10-16: 12 mg via INTRAMUSCULAR
  Filled 2015-10-16: qty 2

## 2015-10-16 NOTE — MAU Note (Signed)
Patient left via stretcher with [facility].

## 2015-10-16 NOTE — MAU Note (Signed)
Pt presents to MAU with complaints of contractions that started early this morning. Denies any vaginal bleeding or abnormal discharge

## 2015-10-16 NOTE — MAU Provider Note (Signed)
Chief Complaint:  No chief complaint on file.   First Provider Initiated Contact with Patient 10/16/15 [place] is a 30 y.o. G3P1102 at 72w0dwho presents to maternity admissions reporting Contractions since yesterday, about 4-6 per hour.  Denies leaking but had some pink spotting yesterday, none today.      She reports good fetal movement, denies LOF, vaginal itching/burning, urinary symptoms, h/a, dizziness, n/v, diarrhea, constipation or fever/chills.  She denies headache, visual changes.   Abdominal Pain This is a new problem. The current episode started yesterday. The onset quality is gradual. The problem occurs intermittently. The problem has been unchanged. The pain is located in the LLQ, RLQ and suprapubic region. The pain is moderate. The quality of the pain is cramping. The abdominal pain does not radiate. Pertinent negatives include no constipation, diarrhea, dysuria, fever, nausea or vomiting. Nothing aggravates the pain. The pain is relieved by nothing. She has tried nothing for the symptoms.   RN Note:  Expand All Collapse All   Pt presents to MAU with complaints of contractions that started early this morning. Denies any vaginal bleeding or abnormal discharge           Past Medical History: Past Medical History  Diagnosis Date  . Chlamydia 2008    Past obstetric history: OB History  Gravida Para Term Preterm AB SAB TAB Ectopic Multiple Living  3 2 1 1  0 0 0 0 0 2    # Outcome Date GA Lbr Len/2nd Weight Sex Delivery Anes PTL Lv  3 Current           2 Preterm 02/11/15 [redacted]w[redacted]d         1 Term 01/12/12 [redacted]w[redacted]d 06:59 / 00:41 6 lb 15.6 oz (3.165 kg) F Vag-Spont EPI  Y     Comments: none      Past Surgical History: Past Surgical History  Procedure Laterality Date  . Wisdom tooth extraction    . Tongue surgery    . Cervical conization w/bx N/A 09/18/2013    Procedure: CONIZATION CERVIX;  Surgeon: Frederico Hamman, MD;  Location: [facility];  Service:  Gynecology;  Laterality: N/A;    Family History: Family History  Problem Relation Age of Onset  . Hypertension Mother   . Asthma Sister   . Cancer Sister     brain tumor and seizures  . Hypertension Maternal Grandmother   . Diabetes Maternal Grandmother     Social History: Social History  Substance Use Topics  . Smoking status: Former Smoker -- 0.00 packs/day for 13 years    Types: Cigarettes  . Smokeless tobacco: None  . Alcohol Use: Yes     Comment: occ but not while preg    Allergies:  Allergies  Allergen Reactions  . Pineapple Itching    Oral itching, no swelling  . Mango Flavor Rash    Caused rash on face.    Meds:  Prescriptions prior to admission  Medication Sig Dispense Refill Last Dose  . calcium carbonate (TUMS - DOSED IN MG ELEMENTAL CALCIUM) 500 MG chewable tablet Chew 1 tablet by mouth daily as needed for indigestion or heartburn.   Taking  . Prenatal Vit-Fe Fumarate-FA (PRENATAL MULTIVITAMIN) TABS tablet Take 1 tablet by mouth daily at 12 noon.   Taking  . PROGESTERONE [facility] Place vaginally.       I have reviewed patient's Past Medical Hx, Surgical Hx, Family Hx, Social Hx, medications and allergies.   ROS:  Review of Systems  Constitutional: Negative for fever, chills and fatigue.  Gastrointestinal: Positive for abdominal pain. Negative for nausea, vomiting, diarrhea and constipation.  Genitourinary: Positive for pelvic pain. Negative for dysuria, flank pain, vaginal bleeding (had some pink spotting yesterday) and vaginal discharge.  Musculoskeletal: Negative for back pain and neck pain.  Neurological: Negative for dizziness.   Other systems negative  Physical Exam  Patient Vitals for the past 24 hrs:  BP Temp Temp src Pulse Resp SpO2 Height Weight  10/16/15 1514 136/69 mmHg 97.8 F (36.6 C) Oral 90 18 100 % 5' 6.14" (1.68 m) 233 lb 6.4 oz (105.87 kg)   Constitutional: Well-developed, well-nourished female in no acute distress.  Cardiovascular:  normal rate and rhythm Respiratory: normal effort, clear to auscultation bilaterally GI: Abd soft, non-tender, gravid appropriate for gestational age.   No rebound or guarding. MS: Extremities nontender, no edema, normal ROM Neurologic: Alert and oriented x 4.  GU: Neg CVAT.  PELVIC EXAM: Dilation: 1 Effacement (%): 80 Cervical Position: Middle Station: Ballotable Presentation: Undeterminable Exam by:: Jimmye Norman CNM  FHT:  Baseline 140 , moderate variability, small accelerations present, no decelerations  Both twins reassuring Contractions: q 3-4 mins Irregular     Labs: Results for orders placed or performed during the hospital encounter of 10/16/15 (from the past 24 hour(s))  Urinalysis, Routine w reflex microscopic (not at [facility])     Status: Abnormal   Collection Time: 10/16/15  3:15 PM  Result Value Ref Range   Color, Urine YELLOW YELLOW   APPearance CLOUDY (A) CLEAR   Specific Gravity, Urine 1.020 1.005 - 1.030   pH 7.0 5.0 - 8.0   Glucose, UA NEGATIVE NEGATIVE mg/dL   Hgb urine dipstick NEGATIVE NEGATIVE   Bilirubin Urine NEGATIVE NEGATIVE   Ketones, ur NEGATIVE NEGATIVE mg/dL   Protein, ur NEGATIVE NEGATIVE mg/dL   Nitrite NEGATIVE NEGATIVE   Leukocytes, UA TRACE (A) NEGATIVE  Urine microscopic-add on     Status: Abnormal   Collection Time: 10/16/15  3:15 PM  Result Value Ref Range   Squamous Epithelial / LPF 6-30 (A) NONE SEEN   WBC, UA 0-5 0 - 5 WBC/hpf   RBC / HPF NONE SEEN 0 - 5 RBC/hpf   Bacteria, UA FEW (A) NONE SEEN   Urine-Other AMORPHOUS URATES/PHOSPHATES   Fetal fibronectin     Status: Abnormal   Collection Time: 10/16/15  3:35 PM  Result Value Ref Range   Fetal Fibronectin POSITIVE (A) NEGATIVE    Imaging:    MAU Course/MDM: I have ordered labs and reviewed results.  NST reviewed Consult Dr Jodi Mourning with presentation, exam findings and test results.  Will start MgSO4 Will give Betamethasone Will need to transfer due to NICU being  full  Assessment: Mono/Di Twins at [redacted]w[redacted]d  Preterm labor History of Preterm Delivery Cervical effacement and dilation  Plan: [facility] to [facility] care    Medication List    ASK your doctor about these medications        calcium carbonate 500 MG chewable tablet  Commonly known as:  TUMS - dosed in mg elemental calcium  Chew 1 tablet by mouth daily as needed for indigestion or heartburn.     prenatal multivitamin Tabs tablet  Take 1 tablet by mouth daily at 12 noon.     PROGESTERONE [facility]  Place vaginally.         Hansel Feinstein CNM, MSN Certified Nurse-Midwife 10/16/2015 3:33 PM

## 2015-10-16 NOTE — H&P (Signed)
RAMATOULAYE PUGLIA is a 30 y.o. female presenting for UC's. Maternal Medical History:  Reason for admission: Contractions.   Fetal activity: Perceived fetal activity is normal.   Last perceived fetal movement was within the past hour.    Prenatal complications: no prenatal complications Prenatal Complications - Diabetes: none.    OB History    Gravida Para Term Preterm AB TAB SAB Ectopic Multiple Living   3 2 1 1  0 0 0 0 0 2     Past Medical History  Diagnosis Date  . Chlamydia 2008   Past Surgical History  Procedure Laterality Date  . Wisdom tooth extraction    . Tongue surgery    . Cervical conization w/bx N/A 09/18/2013    Procedure: CONIZATION CERVIX;  Surgeon: Frederico Hamman, MD;  Location: [facility];  Service: Gynecology;  Laterality: N/A;   Family History: family history includes Asthma in her sister; Cancer in her sister; Diabetes in her maternal grandmother; Hypertension in her maternal grandmother and mother. Social History:  reports that she has quit smoking. Her smoking use included Cigarettes. She smoked 0.00 packs per day for 13 years. She does not have any smokeless tobacco history on file. She reports that she drinks alcohol. She reports that she does not use illicit drugs.   Prenatal Transfer Tool  Maternal Diabetes: No Genetic Screening: Normal Maternal Ultrasounds/Referrals: Normal Fetal Ultrasounds or other Referrals:  Referred to Materal Fetal Medicine  Maternal Substance Abuse:  No Significant Maternal Medications:  None Significant Maternal Lab Results:  None Other Comments:  None  Review of Systems  All other systems reviewed and are negative.   Dilation: 1 Effacement (%): 80 Station: Ballotable Exam by:: Williams CNM Blood pressure 129/67, pulse 90, temperature 97.8 F (36.6 C), temperature source Oral, resp. rate 18, height 5' 6.14" (1.68 m), weight 233 lb 6.4 oz (105.87 kg), last menstrual period 05/10/2015, SpO2 100 %, unknown if currently  breastfeeding. Maternal Exam:  Uterine Assessment: Contraction strength is mild.  Abdomen: Patient reports no abdominal tenderness. Introitus: Normal vulva. Normal vagina.  Cervix: Cervix evaluated by digital exam.     Physical Exam  Nursing note and vitals reviewed. Constitutional: She is oriented to person, place, and time. She appears well-developed and well-nourished.  HENT:  Head: Normocephalic and atraumatic.  Eyes: Conjunctivae are normal. Pupils are equal, round, and reactive to light.  Neck: Normal range of motion. Neck supple.  Cardiovascular: Normal rate and regular rhythm.   Respiratory: Effort normal and breath sounds normal.  GI: Soft. Bowel sounds are normal.  Genitourinary: Vagina normal and uterus normal.  Musculoskeletal: Normal range of motion.  Neurological: She is alert and oriented to person, place, and time.  Skin: Skin is warm and dry.  Psychiatric: She has a normal mood and affect. Her behavior is normal. Judgment and thought content normal.    Prenatal labs: ABO, Rh: --/--/O POS (07/21 2100) Antibody: NEG (07/21 2100) Rubella:   RPR:    HBsAg:    HIV:    GBS:     Assessment/Plan: 24 weeks.  Twins.  Preterm labor.  Procardia protocol for tocolysis.  Magnesium sulfate prophylaxis.  Start betamethasone.  Discussed NICU bed situation with Neonatologist NICU is at capacity at [facility] and diversion recommended.  [name],[name] 10/16/2015, 4:13 PM

## 2015-10-16 NOTE — MAU Note (Signed)
[facility] called RN back to report truck will dispatch to facility in about 15 min.

## 2015-10-21 ENCOUNTER — Encounter (HOSPITAL_COMMUNITY): Payer: Self-pay

## 2015-10-21 ENCOUNTER — Ambulatory Visit (HOSPITAL_COMMUNITY)
Admission: RE | Admit: 2015-10-21 | Discharge: 2015-10-21 | Disposition: A | Discharge status: Home / Self Care | Payer: Medicaid Other | Source: Ambulatory Visit | Attending: Obstetrics | Admitting: Obstetrics

## 2015-10-21 DIAGNOSIS — O3442 Maternal care for other abnormalities of cervix, second trimester: Secondary | ICD-10-CM | POA: Diagnosis not present

## 2015-10-21 DIAGNOSIS — O99212 Obesity complicating pregnancy, second trimester: Secondary | ICD-10-CM | POA: Insufficient documentation

## 2015-10-21 DIAGNOSIS — O30032 Twin pregnancy, monochorionic/diamniotic, second trimester: Secondary | ICD-10-CM | POA: Insufficient documentation

## 2015-10-21 DIAGNOSIS — O09212 Supervision of pregnancy with history of pre-term labor, second trimester: Secondary | ICD-10-CM | POA: Diagnosis present

## 2015-10-21 DIAGNOSIS — O30039 Twin pregnancy, monochorionic/diamniotic, unspecified trimester: Secondary | ICD-10-CM

## 2015-10-21 DIAGNOSIS — Z3A24 24 weeks gestation of pregnancy: Secondary | ICD-10-CM | POA: Insufficient documentation

## 2015-10-31 ENCOUNTER — Encounter (HOSPITAL_COMMUNITY): Payer: Self-pay | Admitting: *Deleted

## 2015-10-31 ENCOUNTER — Inpatient Hospital Stay (HOSPITAL_COMMUNITY)
Admission: AD | Admit: 2015-10-31 | Discharge: 2015-10-31 | Disposition: A | Discharge status: Home / Self Care | Payer: Medicaid Other | Source: Ambulatory Visit | Attending: Obstetrics | Admitting: Obstetrics

## 2015-10-31 DIAGNOSIS — Z87891 Personal history of nicotine dependence: Secondary | ICD-10-CM | POA: Diagnosis not present

## 2015-10-31 DIAGNOSIS — O26892 Other specified pregnancy related conditions, second trimester: Secondary | ICD-10-CM | POA: Diagnosis not present

## 2015-10-31 DIAGNOSIS — Z3A26 26 weeks gestation of pregnancy: Secondary | ICD-10-CM | POA: Diagnosis not present

## 2015-10-31 DIAGNOSIS — H65192 Other acute nonsuppurative otitis media, left ear: Secondary | ICD-10-CM

## 2015-10-31 DIAGNOSIS — H6692 Otitis media, unspecified, left ear: Secondary | ICD-10-CM | POA: Diagnosis not present

## 2015-10-31 MED ORDER — ACETAMINOPHEN-CODEINE 300-30 MG PO TABS: 1.0000 | ORAL_TABLET | ORAL | Status: DC | PRN

## 2015-10-31 MED ORDER — NEOMYCIN-POLYMYXIN-HC 3.5-10000-1 OT SUSP
4.0000 [drp] | Freq: Three times a day (TID) | OTIC | Status: DC
Start: 2015-10-31 — End: 2015-11-02

## 2015-10-31 MED ORDER — AMOXICILLIN 500 MG PO CAPS: 500.0000 mg | ORAL_CAPSULE | Freq: Two times a day (BID) | ORAL | Status: DC

## 2015-10-31 NOTE — MAU Note (Signed)
Pt states she was having left ear pain last night that she rates at a 10/10 but the pain is now a 7-8/10 and only comes when she burps or coughs.  Pt states right now her ear just feels clogged like she can't hear.

## 2015-10-31 NOTE — Discharge Instructions (Signed)

## 2015-10-31 NOTE — MAU Note (Signed)
Pt refuses external fetal monitoring stating, "my babies are fine, I am just here for my ear," but agrees to allow the doppler to be used.

## 2015-10-31 NOTE — MAU Provider Note (Signed)
History   [id number]   Chief Complaint  Patient presents with  . Ear Pain    HPI Breanna Yates is a 30 y.o. female  G3P1102 at [redacted]w[redacted]d IUP here with report of left ear pain that started last night.  Pain was rated a 10/10 and has now decreased to a 7/10.  Pain increased with burping or coughing.  Decreased hearing in ear.  Denies fever, body aches, or any other symptoms of URI.  Requesting to be out of work today and tomorrow.   Pt refuses to have fetal monitoring.     Patient's last menstrual period was 05/10/2015.  OB History  Gravida Para Term Preterm AB SAB TAB Ectopic Multiple Living  3 2 1 1  0 0 0 0 0 2    # Outcome Date GA Lbr Len/2nd Weight Sex Delivery Anes PTL Lv  3 Current           2 Preterm 02/11/15 [redacted]w[redacted]d         1 Term 01/12/12 [redacted]w[redacted]d 06:59 / 00:41 6 lb 15.6 oz (3.165 kg) F Vag-Spont EPI  Y     Comments: none      Past Medical History  Diagnosis Date  . Chlamydia 2008    Family History  Problem Relation Age of Onset  . Hypertension Mother   . Asthma Sister   . Cancer Sister     brain tumor and seizures  . Hypertension Maternal Grandmother   . Diabetes Maternal Grandmother     Social History   Social History  . Marital Status: Single    Spouse Name: N/A  . Number of Children: N/A  . Years of Education: N/A   Social History Main Topics  . Smoking status: Former Smoker -- 0.00 packs/day for 13 years    Types: Cigarettes  . Smokeless tobacco: None  . Alcohol Use: Yes     Comment: occ but not while preg  . Drug Use: No  . Sexual Activity: Yes    Birth Control/ Protection: None   Other Topics Concern  . None   Social History Narrative    Allergies  Allergen Reactions  . Pineapple Itching    Oral itching, no swelling  . Mango Flavor Rash    Caused rash on face.    No current facility-administered medications on file prior to encounter.   Current Outpatient Prescriptions on File Prior to Encounter  Medication Sig Dispense Refill  .  calcium carbonate (TUMS - DOSED IN MG ELEMENTAL CALCIUM) 500 MG chewable tablet Chew 1 tablet by mouth daily as needed for indigestion or heartburn.    . ferrous sulfate 325 (65 FE) MG tablet Take 1 tablet by mouth daily.    . Prenatal Vit-Fe Fumarate-FA (PRENATAL MULTIVITAMIN) TABS tablet Take 1 tablet by mouth daily at 12 noon.    . progesterone (PROMETRIUM) 200 MG capsule Place 1 capsule vaginally at bedtime.  0     Review of Systems  Constitutional: Negative for fever and chills.  HENT: Negative for congestion, rhinorrhea, sinus pressure and sore throat.   Respiratory: Negative for cough.   Genitourinary: Negative for vaginal bleeding and pelvic pain.  All other systems reviewed and are negative.    Physical Exam   Filed Vitals:   10/31/15 1011  BP: 132/76  Pulse: 119  Temp: 98 F (36.7 C)  TempSrc: Oral  Resp: 16    Physical Exam  Constitutional: She is oriented to person, place, and time. She appears well-developed and  well-nourished. No distress.  HENT:  Head: Normocephalic.  Right Ear: Hearing, tympanic membrane, external ear and ear canal normal.  Left Ear: There is tenderness. No drainage. Tympanic membrane is injected and erythematous. Tympanic membrane is not perforated and not bulging. A middle ear effusion is present. Decreased hearing is noted.  Mouth/Throat: Mucous membranes are not dry. No posterior oropharyngeal edema.  Neck: Normal range of motion. Neck supple.  Cardiovascular: Normal rate, regular rhythm and normal heart sounds.   Respiratory: Effort normal and breath sounds normal. No respiratory distress.  GI: Soft. There is no tenderness.  Genitourinary: No bleeding in the vagina.  Musculoskeletal: Normal range of motion. She exhibits no edema.  Neurological: She is alert and oriented to person, place, and time.  Skin: Skin is warm and dry.    MAU Course  Procedures  MDM   Assessment and Plan  30 y.o. [id number] at [redacted]w[redacted]d IUP  Otitis  Media  Plan: Discharge home RX Amoxicillin 500 mg BID x 7 days Otic drops RX Tylenol #3 (10)  Note for work 10/31/15 and 11/01/15  Gwen Pounds, CNM 10/31/2015 10:37 AM

## 2015-11-02 ENCOUNTER — Other Ambulatory Visit: Payer: Self-pay | Admitting: Family

## 2015-11-04 ENCOUNTER — Encounter (HOSPITAL_COMMUNITY): Payer: Self-pay

## 2015-11-04 ENCOUNTER — Ambulatory Visit (HOSPITAL_COMMUNITY)
Admission: RE | Admit: 2015-11-04 | Discharge: 2015-11-04 | Disposition: A | Discharge status: Home / Self Care | Payer: Medicaid Other | Source: Ambulatory Visit | Attending: Obstetrics | Admitting: Obstetrics

## 2015-11-04 DIAGNOSIS — Z3A26 26 weeks gestation of pregnancy: Secondary | ICD-10-CM | POA: Diagnosis not present

## 2015-11-04 DIAGNOSIS — Z9889 Other specified postprocedural states: Secondary | ICD-10-CM | POA: Insufficient documentation

## 2015-11-04 DIAGNOSIS — O30032 Twin pregnancy, monochorionic/diamniotic, second trimester: Secondary | ICD-10-CM | POA: Diagnosis present

## 2015-11-04 DIAGNOSIS — O09292 Supervision of pregnancy with other poor reproductive or obstetric history, second trimester: Secondary | ICD-10-CM | POA: Insufficient documentation

## 2015-11-04 DIAGNOSIS — O30039 Twin pregnancy, monochorionic/diamniotic, unspecified trimester: Secondary | ICD-10-CM

## 2015-11-05 ENCOUNTER — Other Ambulatory Visit (HOSPITAL_COMMUNITY): Payer: Self-pay | Admitting: *Deleted

## 2015-11-05 DIAGNOSIS — O30039 Twin pregnancy, monochorionic/diamniotic, unspecified trimester: Secondary | ICD-10-CM

## 2015-11-18 ENCOUNTER — Ambulatory Visit (HOSPITAL_COMMUNITY)
Admission: RE | Admit: 2015-11-18 | Discharge: 2015-11-18 | Disposition: A | Discharge status: Home / Self Care | Payer: Medicaid Other | Source: Ambulatory Visit | Attending: Obstetrics | Admitting: Obstetrics

## 2015-11-18 ENCOUNTER — Encounter (HOSPITAL_COMMUNITY): Payer: Self-pay

## 2015-11-18 ENCOUNTER — Other Ambulatory Visit (HOSPITAL_COMMUNITY): Payer: Self-pay | Admitting: Maternal and Fetal Medicine

## 2015-11-18 VITALS — BP 121/77 | HR 100 | Wt 236.4 lb

## 2015-11-18 DIAGNOSIS — O30033 Twin pregnancy, monochorionic/diamniotic, third trimester: Secondary | ICD-10-CM | POA: Insufficient documentation

## 2015-11-18 DIAGNOSIS — Z3A28 28 weeks gestation of pregnancy: Secondary | ICD-10-CM | POA: Insufficient documentation

## 2015-11-18 DIAGNOSIS — O3443 Maternal care for other abnormalities of cervix, third trimester: Secondary | ICD-10-CM | POA: Insufficient documentation

## 2015-11-18 DIAGNOSIS — O30039 Twin pregnancy, monochorionic/diamniotic, unspecified trimester: Secondary | ICD-10-CM

## 2015-11-18 DIAGNOSIS — O09213 Supervision of pregnancy with history of pre-term labor, third trimester: Secondary | ICD-10-CM | POA: Insufficient documentation

## 2015-11-18 DIAGNOSIS — O3430 Maternal care for cervical incompetence, unspecified trimester: Secondary | ICD-10-CM

## 2015-11-25 ENCOUNTER — Encounter (HOSPITAL_COMMUNITY): Payer: Self-pay

## 2015-11-25 ENCOUNTER — Other Ambulatory Visit (HOSPITAL_COMMUNITY): Payer: Self-pay | Admitting: Maternal and Fetal Medicine

## 2015-11-25 ENCOUNTER — Ambulatory Visit (HOSPITAL_COMMUNITY)
Admission: RE | Admit: 2015-11-25 | Discharge: 2015-11-25 | Disposition: A | Discharge status: Home / Self Care | Payer: Medicaid Other | Source: Ambulatory Visit | Attending: Obstetrics | Admitting: Obstetrics

## 2015-11-25 DIAGNOSIS — O3443 Maternal care for other abnormalities of cervix, third trimester: Secondary | ICD-10-CM

## 2015-11-25 DIAGNOSIS — Z3A29 29 weeks gestation of pregnancy: Secondary | ICD-10-CM

## 2015-11-25 DIAGNOSIS — O26873 Cervical shortening, third trimester: Secondary | ICD-10-CM | POA: Insufficient documentation

## 2015-11-25 DIAGNOSIS — O30033 Twin pregnancy, monochorionic/diamniotic, third trimester: Secondary | ICD-10-CM

## 2015-11-25 DIAGNOSIS — O09893 Supervision of other high risk pregnancies, third trimester: Secondary | ICD-10-CM

## 2015-11-25 DIAGNOSIS — O09213 Supervision of pregnancy with history of pre-term labor, third trimester: Secondary | ICD-10-CM

## 2015-11-25 DIAGNOSIS — O24419 Gestational diabetes mellitus in pregnancy, unspecified control: Secondary | ICD-10-CM | POA: Insufficient documentation

## 2015-11-25 DIAGNOSIS — O3430 Maternal care for cervical incompetence, unspecified trimester: Secondary | ICD-10-CM

## 2015-11-26 ENCOUNTER — Encounter: Payer: Medicaid Other | Attending: Obstetrics | Admitting: *Deleted

## 2015-11-26 ENCOUNTER — Ambulatory Visit (HOSPITAL_COMMUNITY)
Admission: RE | Admit: 2015-11-26 | Discharge: 2015-11-26 | Disposition: A | Discharge status: Home / Self Care | Payer: Medicaid Other | Source: Ambulatory Visit | Attending: Obstetrics | Admitting: Obstetrics

## 2015-11-26 DIAGNOSIS — Z029 Encounter for administrative examinations, unspecified: Secondary | ICD-10-CM | POA: Diagnosis present

## 2015-11-26 DIAGNOSIS — O24419 Gestational diabetes mellitus in pregnancy, unspecified control: Secondary | ICD-10-CM

## 2015-11-26 DIAGNOSIS — O24410 Gestational diabetes mellitus in pregnancy, diet controlled: Secondary | ICD-10-CM

## 2015-11-26 HISTORY — DX: Gestational diabetes mellitus in pregnancy, unspecified control: O24.419

## 2015-11-26 NOTE — Consult Note (Signed)
  Patient was seen on 11/26/15 for Gestational Diabetes self-management class [facility]. She states she is pregnant with twins so I increased her carb intake by 15 grams at each of the 3 meals. The following learning objectives were met by the patient during this course:   States the definition of Gestational Diabetes  States why dietary management is important in controlling blood glucose  Describes the effects each nutrient has on blood glucose levels  Demonstrates ability to create a balanced meal plan  Demonstrates carbohydrate counting   States when to check blood glucose levels  Demonstrates proper blood glucose monitoring techniques  States the effect of stress and exercise on blood glucose levels  Blood glucose monitor Rx information given: Patient to call MD for Rx for Accu Chek Aviva Meter with Fast Clix drum  Patient instructed to monitor glucose levels: FBS: 60 - <90 1 hour: <140 [facility]) 2 hour: <120  *Patient received handouts:  Nutrition Diabetes and Pregnancy  Carbohydrate Counting List  Patient will be seen for follow-up as needed.

## 2015-12-01 ENCOUNTER — Encounter (HOSPITAL_COMMUNITY): Payer: Self-pay

## 2015-12-01 ENCOUNTER — Ambulatory Visit (HOSPITAL_COMMUNITY)
Admission: RE | Admit: 2015-12-01 | Discharge: 2015-12-01 | Disposition: A | Discharge status: Home / Self Care | Payer: Medicaid Other | Source: Ambulatory Visit | Attending: Obstetrics | Admitting: Obstetrics

## 2015-12-01 ENCOUNTER — Other Ambulatory Visit (HOSPITAL_COMMUNITY): Payer: Self-pay | Admitting: Maternal and Fetal Medicine

## 2015-12-01 DIAGNOSIS — O26873 Cervical shortening, third trimester: Secondary | ICD-10-CM

## 2015-12-01 DIAGNOSIS — O3443 Maternal care for other abnormalities of cervix, third trimester: Secondary | ICD-10-CM | POA: Insufficient documentation

## 2015-12-01 DIAGNOSIS — O09893 Supervision of other high risk pregnancies, third trimester: Secondary | ICD-10-CM

## 2015-12-01 DIAGNOSIS — O09213 Supervision of pregnancy with history of pre-term labor, third trimester: Secondary | ICD-10-CM | POA: Diagnosis not present

## 2015-12-01 DIAGNOSIS — Z3A30 30 weeks gestation of pregnancy: Secondary | ICD-10-CM | POA: Diagnosis not present

## 2015-12-01 DIAGNOSIS — O30033 Twin pregnancy, monochorionic/diamniotic, third trimester: Secondary | ICD-10-CM

## 2015-12-01 DIAGNOSIS — O24419 Gestational diabetes mellitus in pregnancy, unspecified control: Secondary | ICD-10-CM

## 2015-12-01 DIAGNOSIS — O30039 Twin pregnancy, monochorionic/diamniotic, unspecified trimester: Secondary | ICD-10-CM

## 2015-12-01 DIAGNOSIS — O24410 Gestational diabetes mellitus in pregnancy, diet controlled: Secondary | ICD-10-CM

## 2015-12-01 NOTE — Consult Note (Signed)
MFM consult  30 yr old [id number] at [redacted]w[redacted]d with monochorionic/diamniotic twin gestation, gestational diabetes, history of preterm delivery, and cervical shortening referred by Dr. Ruthann Cancer for fetal growth and consult.  Ultrasound today shows: 1. Monochorionic/diamniotic twin gestation; the dividing membrane is seen. 2. Estimated fetal weight for twin A is in the 36th%; twin B is in the 32nd%; the fetuses are concordant. 3. Anterior placenta without evidence of previa. 4. Normal amniotic fluid volume. 5. The limited anatomy surveys are normal. Normal stomach and bladder seen in each fetus.  I counseled the patient as follows: 1. Appropriate fetal growth. 2. Monochorionic/diamniotic twin gestation: - previously counseled - recommend fetal growth every 4 weeks - recommend evaluation for TTTS every 2 weeks; no evidence of TTTS seen on today's exam - recommend start antenatal testing at 32 weeks - recommend delivery by 37 weeks - normal fetal echocardiograms 3. History of preterm delivery/ short cervix: - previously counseled - s/p betamethasone - on vaginal progesterone - recommend preterm labor precautions 4. Gestational diabetes: - recent diagnosis - has not started checking sugars although has received diabetic education - recommend fetal surveillance as above Discussed increased risks in pregnancy include: fetal macrosomia, shoulder dystocia, and increased risk of requiring a Cesarean delivery. There is also an increased risk of developing preeclampsia during the pregnancy and an increased risk of type II diabetes in the future. I discussed there is an increased risk of stillbirth, neonatal hypoglycemia, neonatal jaundice, and neonatal electrolyte disturbances. I recommend strict glucose control maintaining fasting blood sugars <90 and 2 hour postprandial values <120. Patient prefers to check 1 hour postprandial values- recommend keep these <140. Patient has not started checking sugars-  discussed importance of checking sugars and risks of elevated sugars especially stillbirth. Recommend patient have sugars reviewed at least weekly. Instructed to call if her sugars are elevated once she starts checking. I recommend screening for diabetes 6 weeks postpartum.  I spent a total of 20 minutes with the patient of which >50% was in face to face consultation.  [place], MD

## 2015-12-02 ENCOUNTER — Other Ambulatory Visit (HOSPITAL_COMMUNITY): Payer: Self-pay

## 2015-12-02 ENCOUNTER — Ambulatory Visit (HOSPITAL_COMMUNITY): Payer: Medicaid Other

## 2015-12-04 ENCOUNTER — Other Ambulatory Visit (HOSPITAL_COMMUNITY): Payer: Self-pay | Admitting: *Deleted

## 2015-12-04 DIAGNOSIS — O30039 Twin pregnancy, monochorionic/diamniotic, unspecified trimester: Secondary | ICD-10-CM

## 2015-12-05 ENCOUNTER — Inpatient Hospital Stay (HOSPITAL_COMMUNITY)
Admission: AD | Admit: 2015-12-05 | Discharge: 2015-12-07 | DRG: 775 | Disposition: A | Discharge status: Home / Self Care | Payer: Medicaid Other | Source: Ambulatory Visit | Attending: Obstetrics | Admitting: Obstetrics

## 2015-12-05 ENCOUNTER — Encounter (HOSPITAL_COMMUNITY): Payer: Self-pay | Admitting: *Deleted

## 2015-12-05 DIAGNOSIS — O328XX2 Maternal care for other malpresentation of fetus, fetus 2: Secondary | ICD-10-CM | POA: Diagnosis present

## 2015-12-05 DIAGNOSIS — Z833 Family history of diabetes mellitus: Secondary | ICD-10-CM | POA: Diagnosis not present

## 2015-12-05 DIAGNOSIS — Z3A31 31 weeks gestation of pregnancy: Secondary | ICD-10-CM | POA: Diagnosis not present

## 2015-12-05 DIAGNOSIS — Z825 Family history of asthma and other chronic lower respiratory diseases: Secondary | ICD-10-CM | POA: Diagnosis not present

## 2015-12-05 DIAGNOSIS — O24420 Gestational diabetes mellitus in childbirth, diet controlled: Secondary | ICD-10-CM | POA: Diagnosis present

## 2015-12-05 DIAGNOSIS — Z87891 Personal history of nicotine dependence: Secondary | ICD-10-CM | POA: Diagnosis not present

## 2015-12-05 DIAGNOSIS — O30033 Twin pregnancy, monochorionic/diamniotic, third trimester: Secondary | ICD-10-CM | POA: Diagnosis present

## 2015-12-05 DIAGNOSIS — Z8249 Family history of ischemic heart disease and other diseases of the circulatory system: Secondary | ICD-10-CM

## 2015-12-05 DIAGNOSIS — IMO0001 Reserved for inherently not codable concepts without codable children: Secondary | ICD-10-CM

## 2015-12-05 LAB — TYPE AND SCREEN
ABO/RH(D): O POS
ANTIBODY SCREEN: NEGATIVE

## 2015-12-05 LAB — CBC
HEMATOCRIT: 35.7 % — AB (ref 36.0–46.0)
HEMOGLOBIN: 12 g/dL (ref 12.0–15.0)
MCH: 28.3 pg (ref 26.0–34.0)
MCHC: 33.6 g/dL (ref 30.0–36.0)
MCV: 84.2 fL (ref 78.0–100.0)
Platelets: 228 10*3/uL (ref 150–400)
RBC: 4.24 MIL/uL (ref 3.87–5.11)
RDW: 15.1 % (ref 11.5–15.5)
WBC: 12 10*3/uL — AB (ref 4.0–10.5)

## 2015-12-05 LAB — COMPREHENSIVE METABOLIC PANEL
ALBUMIN: 2.8 g/dL — AB (ref 3.5–5.0)
ALK PHOS: 114 U/L (ref 38–126)
ALT: 12 U/L — ABNORMAL LOW (ref 14–54)
AST: 18 U/L (ref 15–41)
Anion gap: 11 (ref 5–15)
BILIRUBIN TOTAL: 0.5 mg/dL (ref 0.3–1.2)
BUN: 7 mg/dL (ref 6–20)
CO2: 22 mmol/L (ref 22–32)
Calcium: 9.3 mg/dL (ref 8.9–10.3)
Chloride: 102 mmol/L (ref 101–111)
Creatinine, Ser: 0.5 mg/dL (ref 0.44–1.00)
GFR calc Af Amer: >60 mL/min (ref >60)
GFR calc non Af Amer: >60 mL/min (ref >60)
GLUCOSE: 101 mg/dL — AB (ref 65–99)
POTASSIUM: 3.9 mmol/L (ref 3.5–5.1)
Sodium: 135 mmol/L (ref 135–145)
TOTAL PROTEIN: 6.2 g/dL — AB (ref 6.5–8.1)

## 2015-12-05 LAB — URIC ACID: Uric Acid, Serum: 4.6 mg/dL (ref 2.3–6.6)

## 2015-12-05 LAB — RPR: RPR: NONREACTIVE

## 2015-12-05 LAB — PROTEIN / CREATININE RATIO, URINE
Creatinine, Urine: 124 mg/dL
PROTEIN CREATININE RATIO: 0.25 mg/mg{creat} — AB (ref 0.00–0.15)
TOTAL PROTEIN, URINE: 31 mg/dL

## 2015-12-05 LAB — LACTATE DEHYDROGENASE: LDH: 137 U/L (ref 98–192)

## 2015-12-05 MED ORDER — ONDANSETRON HCL 4 MG/2ML IJ SOLN: 4.0000 mg | INTRAMUSCULAR | Status: DC | PRN

## 2015-12-05 MED ORDER — OXYCODONE-ACETAMINOPHEN 5-325 MG PO TABS: 2.0000 | ORAL_TABLET | ORAL | Status: DC | PRN

## 2015-12-05 MED ORDER — OXYTOCIN BOLUS FROM INFUSION
500.0000 mL | INTRAVENOUS | Status: DC
  Administered 2015-12-05: 500 mL via INTRAVENOUS

## 2015-12-05 MED ORDER — FENTANYL CITRATE (PF) 100 MCG/2ML IJ SOLN
INTRAMUSCULAR | Status: AC
  Filled 2015-12-05: qty 2

## 2015-12-05 MED ORDER — DIBUCAINE 1 % RE OINT: 1.0000 "application " | TOPICAL_OINTMENT | RECTAL | Status: DC | PRN

## 2015-12-05 MED ORDER — OXYTOCIN 40 UNITS IN LACTATED RINGERS INFUSION - SIMPLE MED: 2.5000 [IU]/h | INTRAVENOUS | Status: DC | PRN

## 2015-12-05 MED ORDER — OXYTOCIN 40 UNITS IN LACTATED RINGERS INFUSION - SIMPLE MED
2.5000 [IU]/h | INTRAVENOUS | Status: DC
  Filled 2015-12-05: qty 1000

## 2015-12-05 MED ORDER — OXYCODONE-ACETAMINOPHEN 5-325 MG PO TABS: 1.0000 | ORAL_TABLET | ORAL | Status: DC | PRN

## 2015-12-05 MED ORDER — AMLODIPINE BESYLATE 5 MG PO TABS
5.0000 mg | ORAL_TABLET | Freq: Every day | ORAL | Status: DC
  Administered 2015-12-05 – 2015-12-07 (×3): 5 mg via ORAL
  Filled 2015-12-05 (×3): qty 1

## 2015-12-05 MED ORDER — FENTANYL CITRATE (PF) 100 MCG/2ML IJ SOLN
100.0000 ug | Freq: Once | INTRAMUSCULAR | Status: AC
  Administered 2015-12-05: 100 ug via INTRAVENOUS

## 2015-12-05 MED ORDER — ACETAMINOPHEN 325 MG PO TABS: 650.0000 mg | ORAL_TABLET | ORAL | Status: DC | PRN

## 2015-12-05 MED ORDER — FLEET ENEMA 7-19 GM/118ML RE ENEM: 1.0000 | ENEMA | RECTAL | Status: DC | PRN

## 2015-12-05 MED ORDER — COCONUT OIL OIL: 1.0000 "application " | TOPICAL_OIL | Status: DC | PRN

## 2015-12-05 MED ORDER — BENZOCAINE-MENTHOL 20-0.5 % EX AERO
1.0000 "application " | INHALATION_SPRAY | CUTANEOUS | Status: DC | PRN
  Administered 2015-12-07: 1 via TOPICAL
  Filled 2015-12-05: qty 56

## 2015-12-05 MED ORDER — LABETALOL HCL 200 MG PO TABS
200.0000 mg | ORAL_TABLET | Freq: Three times a day (TID) | ORAL | Status: DC
  Administered 2015-12-05 – 2015-12-07 (×6): 200 mg via ORAL
  Filled 2015-12-05 (×7): qty 1

## 2015-12-05 MED ORDER — ONDANSETRON HCL 4 MG PO TABS: 4.0000 mg | ORAL_TABLET | ORAL | Status: DC | PRN

## 2015-12-05 MED ORDER — LABETALOL HCL 200 MG PO TABS: 200.0000 mg | ORAL_TABLET | ORAL | Status: DC

## 2015-12-05 MED ORDER — DIPHENHYDRAMINE HCL 25 MG PO CAPS: 25.0000 mg | ORAL_CAPSULE | Freq: Four times a day (QID) | ORAL | Status: DC | PRN

## 2015-12-05 MED ORDER — OXYCODONE-ACETAMINOPHEN 5-325 MG PO TABS
1.0000 | ORAL_TABLET | ORAL | Status: DC | PRN
  Administered 2015-12-05 (×2): 1 via ORAL
  Filled 2015-12-05 (×2): qty 1

## 2015-12-05 MED ORDER — LIDOCAINE HCL (PF) 1 % IJ SOLN
30.0000 mL | INTRAMUSCULAR | Status: DC | PRN
  Filled 2015-12-05 (×2): qty 30

## 2015-12-05 MED ORDER — PRENATAL MULTIVITAMIN CH
1.0000 | ORAL_TABLET | Freq: Every day | ORAL | Status: DC
  Administered 2015-12-05 – 2015-12-06 (×2): 1 via ORAL
  Filled 2015-12-05 (×2): qty 1

## 2015-12-05 MED ORDER — CITRIC ACID-SODIUM CITRATE 334-500 MG/5ML PO SOLN: 30.0000 mL | ORAL | Status: DC | PRN

## 2015-12-05 MED ORDER — SIMETHICONE 80 MG PO CHEW
80.0000 mg | CHEWABLE_TABLET | ORAL | Status: DC | PRN
  Administered 2015-12-05: 80 mg via ORAL
  Filled 2015-12-05: qty 1

## 2015-12-05 MED ORDER — ONDANSETRON HCL 4 MG/2ML IJ SOLN: 4.0000 mg | Freq: Four times a day (QID) | INTRAMUSCULAR | Status: DC | PRN

## 2015-12-05 MED ORDER — FENTANYL CITRATE (PF) 100 MCG/2ML IJ SOLN: 100.0000 ug | Freq: Once | INTRAMUSCULAR | Status: DC

## 2015-12-05 MED ORDER — SENNOSIDES-DOCUSATE SODIUM 8.6-50 MG PO TABS
2.0000 | ORAL_TABLET | ORAL | Status: DC
  Administered 2015-12-06 – 2015-12-07 (×2): 2 via ORAL
  Filled 2015-12-05 (×2): qty 2

## 2015-12-05 MED ORDER — TETANUS-DIPHTH-ACELL PERTUSSIS 5-2.5-18.5 LF-MCG/0.5 IM SUSP
0.5000 mL | Freq: Once | INTRAMUSCULAR | Status: AC
  Administered 2015-12-06: 0.5 mL via INTRAMUSCULAR
  Filled 2015-12-05: qty 0.5

## 2015-12-05 MED ORDER — ZOLPIDEM TARTRATE 5 MG PO TABS: 5.0000 mg | ORAL_TABLET | Freq: Every evening | ORAL | Status: DC | PRN

## 2015-12-05 MED ORDER — LACTATED RINGERS IV SOLN: 500.0000 mL | INTRAVENOUS | Status: DC | PRN

## 2015-12-05 MED ORDER — IBUPROFEN 600 MG PO TABS
600.0000 mg | ORAL_TABLET | Freq: Four times a day (QID) | ORAL | Status: DC
  Administered 2015-12-05 – 2015-12-07 (×8): 600 mg via ORAL
  Filled 2015-12-05 (×8): qty 1

## 2015-12-05 MED ORDER — LACTATED RINGERS IV SOLN
INTRAVENOUS | Status: DC
  Administered 2015-12-05: 08:00:00 via INTRAVENOUS

## 2015-12-05 MED ORDER — WITCH HAZEL-GLYCERIN EX PADS: 1.0000 "application " | MEDICATED_PAD | CUTANEOUS | Status: DC | PRN

## 2015-12-05 MED ORDER — PANTOPRAZOLE SODIUM 40 MG PO TBEC
40.0000 mg | DELAYED_RELEASE_TABLET | Freq: Every day | ORAL | Status: DC
  Administered 2015-12-05 – 2015-12-07 (×3): 40 mg via ORAL
  Filled 2015-12-05 (×3): qty 1

## 2015-12-05 MED ORDER — OXYCODONE-ACETAMINOPHEN 5-325 MG PO TABS
2.0000 | ORAL_TABLET | ORAL | Status: DC | PRN
  Administered 2015-12-06 – 2015-12-07 (×2): 2 via ORAL
  Filled 2015-12-05 (×2): qty 2

## 2015-12-05 NOTE — MAU Note (Signed)
Dr. Jodi Mourning notified by Yvonne Kendall CNM

## 2015-12-05 NOTE — MAU Provider Note (Signed)
  History     CSN: [id number]  Arrival date and time: 12/05/15 0735   None     No chief complaint on file.  HPI  Breanna Yates 30 y.o. [id number] @ [redacted]w[redacted]d twin gestation presents to MAU stating she wants to push.  Past Medical History  Diagnosis Date  . Chlamydia 2008    Past Surgical History  Procedure Laterality Date  . Wisdom tooth extraction    . Tongue surgery    . Cervical conization w/bx N/A 09/18/2013    Procedure: CONIZATION CERVIX;  Surgeon: Frederico Hamman, MD;  Location: [facility];  Service: Gynecology;  Laterality: N/A;    Family History  Problem Relation Age of Onset  . Hypertension Mother   . Asthma Sister   . Cancer Sister     brain tumor and seizures  . Hypertension Maternal Grandmother   . Diabetes Maternal Grandmother     Social History  Substance Use Topics  . Smoking status: Former Smoker -- 0.00 packs/day for 13 years    Types: Cigarettes  . Smokeless tobacco: Never Used  . Alcohol Use: Yes     Comment: occ but not while preg    Allergies:  Allergies  Allergen Reactions  . Pineapple Itching    Oral itching, no swelling  . Mango Flavor Rash    Caused rash on face.    Prescriptions prior to admission  Medication Sig Dispense Refill Last Dose  . Acetaminophen-Codeine (TYLENOL/CODEINE #3) 300-30 MG tablet Take 1 tablet by mouth every 4 (four) hours as needed for pain. (Patient not taking: Reported on 11/18/2015) 10 tablet 0 Not Taking  . amoxicillin (AMOXIL) 500 MG capsule Take 1 capsule (500 mg total) by mouth 2 (two) times daily. (Patient not taking: Reported on 11/18/2015) 14 capsule 0 Not Taking  . calcium carbonate (TUMS - DOSED IN MG ELEMENTAL CALCIUM) 500 MG chewable tablet Chew 1 tablet by mouth daily as needed for indigestion or heartburn.   Taking  . ferrous sulfate 325 (65 FE) MG tablet Take 1 tablet by mouth daily.   Taking  . neomycin-polymyxin-hydrocortisone (CORTISPORIN) 3.5-10000-1 otic suspension PLACE 4 DROPS INTO THE LEFT EAR  3 (THREE) TIMES DAILY. (Patient not taking: Reported on 11/18/2015) 10 mL 0 Not Taking  . Prenatal Vit-Fe Fumarate-FA (PRENATAL MULTIVITAMIN) TABS tablet Take 1 tablet by mouth daily at 12 noon.    Taking  . progesterone (PROMETRIUM) 200 MG capsule Place 1 capsule vaginally at bedtime.  0 Taking    Review of Systems  Constitutional: Negative for fever.  Gastrointestinal: Positive for abdominal pain.  All other systems reviewed and are negative.  Physical Exam   Last menstrual period 05/10/2015, unknown if currently breastfeeding.  Physical Exam  Nursing note and vitals reviewed. Constitutional: She is oriented to person, place, and time. She appears well-developed and well-nourished. No distress.  Cardiovascular: Normal rate.   Respiratory: Effort normal.  Neurological: She is alert and oriented to person, place, and time.  Skin: Skin is warm and dry.  Psychiatric: She has a normal mood and affect.    MAU Course  Procedures  MDM Cervical exam is Complete/Complete +1 BBOW. Dr Jodi Mourning was called and is on his way. Pt taken to L/D; [facility] standing in, NICU in L/D.  Assessment and Plan  Preterm Labor [place] Gestation  Admit  Clemmons,Lori Grissett 12/05/2015, 7:53 AM

## 2015-12-05 NOTE — H&P (Signed)
Breanna Yates is a 30 y.o. female presenting for UC's. Maternal Medical History:  Reason for admission: Contractions.   Fetal activity: Perceived fetal activity is normal.   Last perceived fetal movement was within the past hour.    Prenatal complications: Preterm labor.   Prenatal Complications - Diabetes: gestational. Diabetes is managed by diet.      OB History    Gravida Para Term Preterm AB TAB SAB Ectopic Multiple Living   3 3 1 2  0 0 0 0 1 3     Past Medical History  Diagnosis Date  . Chlamydia 2008   Past Surgical History  Procedure Laterality Date  . Wisdom tooth extraction    . Tongue surgery    . Cervical conization w/bx N/A 09/18/2013    Procedure: CONIZATION CERVIX;  Surgeon: Frederico Hamman, MD;  Location: [facility];  Service: Gynecology;  Laterality: N/A;   Family History: family history includes Asthma in her sister; Cancer in her sister; Diabetes in her maternal grandmother; Hypertension in her maternal grandmother and mother. Social History:  reports that she has quit smoking. Her smoking use included Cigarettes. She smoked 0.00 packs per day for 13 years. She has never used smokeless tobacco. She reports that she drinks alcohol. She reports that she does not use illicit drugs.   Prenatal Transfer Tool  Maternal Diabetes: GDM, diet controlled Genetic Screening: Normal Maternal Ultrasounds/Referrals: Normal Fetal Ultrasounds or other Referrals:  Referred to [facility] .  Normal fetal ECHO Maternal Substance Abuse:  No Significant Maternal Medications:  None Significant Maternal Lab Results:  None Other Comments:  None  Review of Systems  All other systems reviewed and are negative.   Dilation: 10 Exam by:: Yvonne Kendall, CNM Last menstrual period 05/10/2015, unknown if currently breastfeeding. Maternal Exam:  Uterine Assessment: Contraction strength is moderate.  Abdomen: Patient reports no abdominal tenderness. Fetal presentation:  vertex  Introitus: Normal vulva. Normal vagina.  Cervix: Cervix evaluated by digital exam.     Physical Exam  Nursing note and vitals reviewed. Constitutional: She is oriented to person, place, and time. She appears well-developed and well-nourished.  HENT:  Head: Normocephalic and atraumatic.  Eyes: Conjunctivae are normal. Pupils are equal, round, and reactive to light.  Neck: Normal range of motion. Neck supple.  Cardiovascular: Normal rate and regular rhythm.   Respiratory: Effort normal and breath sounds normal.  GI: Soft.  Genitourinary: Vagina normal and uterus normal.  Musculoskeletal: Normal range of motion.  Neurological: She is alert and oriented to person, place, and time.  Skin: Skin is warm and dry.  Psychiatric: She has a normal mood and affect. Her behavior is normal. Judgment and thought content normal.    Prenatal labs: ABO, Rh: --/--/O POS (07/21 2100) Antibody: NEG (07/21 2100) Rubella:   RPR:    HBsAg:    HIV:    GBS:     Assessment/Plan: 31 weeks.  Twins.  Active labor.  Admit.   Reesha Debes A 12/05/2015, 8:49 AM

## 2015-12-05 NOTE — Progress Notes (Signed)
Dr Jodi Mourning called to make aware of BP's. Patient asymptomatic. No vision changes, clonus or edema. [place] labs ordered. Ok to transfer patient to women's unit while awaiting lab results. Will start Magnesium if labs abnormal.

## 2015-12-05 NOTE — Lactation Note (Signed)
This note was copied from a baby's chart. Lactation Consultation Note  Patient Name: Breanna Yates Today's Date: 12/05/2015 Reason for consult: Initial assessment;NICU baby NICU twins 36 hours old, 28w1dGA. Mom reports that she has nursed and pump with other children, but her milk went away each time at 2 weeks. Mom has a DEBP with kit at bedside, but requested a manual pump. Mom reports that she does not want to use DEBP at this time but would prefer to use hand pump. Discussed supply and demand and importance of using DEBP early and often--8 times/24 hours for 15 minutes. Discussed benefits of using DEBP. Patient's bedside nurse reports that she has enc mom to use DEBP but mom did not want to pump at all earlier. Enc mom to ask for assistance with using the DEBP as needed. [facility] [place], aware of [place]/BFS[place] and [place] after [place]. Mom has NICU booklet and colostrum containers at bedside.   Maternal Data Has patient been taught Hand Expression?: Yes Does the patient have breastfeeding experience prior to this delivery?: Yes  Feeding    LATCH Score/Interventions                      Lactation Tools Discussed/Used Tools: Pump Breast pump type: Manual   Consult Status Consult Status: Follow-up Date: 12/06/15 Follow-up type: In-patient    WInocente Salles5/20/2017, 10:00 PM

## 2015-12-05 NOTE — MAU Note (Signed)
Notified NICU and Derrill Memo CNM to be on standby patient to room 173.

## 2015-12-05 NOTE — Plan of Care (Signed)
Problem: Activity: Goal: Ability to tolerate increased activity will improve Outcome: Completed/Met Date Met:  12/05/15 Tolerates walking to NICU well.  Problem: Coping: Goal: Ability to cope will improve Outcome: Completed/Met Date Met:  12/05/15 Vaginal bleeding is WNL at this time.  Problem: Nutritional: Goal: Dietary intake will improve Outcome: Completed/Met Date Met:  12/05/15 Tolerates a Regular diet well. Goal: Mother's verbalization of comfort with breastfeeding process will improve Outcome: Not Applicable Date Met:  66/91/67 Infants are in NICU and babies are not to breast at this time.Patient is pumpng.  Problem: Pain Management: Goal: General experience of comfort will improve and pain level will decrease Outcome: Completed/Met Date Met:  12/05/15 Good pain control on Motrin at this time.  Problem: Urinary Elimination: Goal: Ability to reestablish a normal urinary elimination pattern will improve Outcome: Completed/Met Date Met:  12/05/15 Voids without difficulty.

## 2015-12-06 LAB — CBC
HCT: 34.2 % — ABNORMAL LOW (ref 36.0–46.0)
HEMOGLOBIN: 11.5 g/dL — AB (ref 12.0–15.0)
MCH: 27.9 pg (ref 26.0–34.0)
MCHC: 33.6 g/dL (ref 30.0–36.0)
MCV: 83 fL (ref 78.0–100.0)
Platelets: 231 10*3/uL (ref 150–400)
RBC: 4.12 MIL/uL (ref 3.87–5.11)
RDW: 15.2 % (ref 11.5–15.5)
WBC: 11.5 10*3/uL — AB (ref 4.0–10.5)

## 2015-12-06 LAB — HEPATITIS B SURFACE ANTIGEN: HEP B S AG: NEGATIVE

## 2015-12-06 NOTE — Lactation Note (Signed)
This note was copied from a baby's chart. Lactation Consultation Note  Patient Name: Breanna Yates Date: 12/06/2015 Reason for consult: Follow-up assessment;NICU baby;Infant < 6lbs;Multiple gestation   Met with mom briefly in her room. She reports she plans to pump but has not done so. Mom reports she plans to try to manually pump and if she gets milk , she will use DEBP. Discussed supply and demand and need for frequent breast stimulation to initiate a milk supply. Mom said she would begin pumping but seems reluctant to do so. She is eating breakfast currently, will return to help set up her pump. Mom's RN reports she has encouraged mom to pump and mom declined.    Maternal Data    Feeding    LATCH Score/Interventions                      Lactation Tools Discussed/Used     Consult Status Consult Status: Follow-up Date: 12/07/15 Follow-up type: In-patient    [name] Hice 12/06/2015, 9:54 AM

## 2015-12-06 NOTE — Lactation Note (Signed)
This note was copied from a baby's chart. Lactation Consultation Note  Patient Name: Boy A Abigaille Hougen [id number] Date: 12/06/2015   Reason for consult: Follow-up assessment;NICU baby;Multiple gestation   [facility] Referral faxed to [facility] office.   Maternal Data Has patient been taught Hand Expression?:  (Mom declined and said she know how) Does the patient have breastfeeding experience prior to this delivery?: Yes  Feeding    LATCH Score/Interventions                      Lactation Tools Discussed/Used WIC Program: Yes Pump Review: Setup, frequency, and cleaning;Milk Storage Initiated by:: Nonah Mattes, RN, IBCLC Date initiated:: 12/06/15   Consult Status Consult Status: Follow-up Date: 12/07/15 Follow-up type: In-patient    [name] Trinity Hyland 12/06/2015, 10:52 AM

## 2015-12-06 NOTE — Progress Notes (Signed)
Post Partum Day 1 Subjective: no complaints  Objective: Blood pressure 124/56, pulse 79, temperature 97.7 F (36.5 C), temperature source Oral, resp. rate 18, height 5\' 7"  (1.702 m), weight 244 lb (110.678 kg), last menstrual period 05/10/2015, SpO2 100 %, unknown if currently breastfeeding.  Physical Exam:  General: alert and no distress Lochia: appropriate Uterine Fundus: firm Incision: none DVT Evaluation: No evidence of DVT seen on physical exam.   Recent Labs  12/05/15 0750 12/06/15 0547  HGB 12.0 11.5*  HCT 35.7* 34.2*    Assessment/Plan: Plan for discharge tomorrow   LOS: 1 day   Breanna Yates A 12/06/2015, 5:23 PM

## 2015-12-06 NOTE — Lactation Note (Signed)
This note was copied from a baby's chart. Lactation Consultation Note  Patient Name: Breanna Yates [id number] Date: 12/06/2015 Reason for consult: Follow-up assessment;NICU baby;Multiple gestation   Mom just returned to room. She reports she has not visited infant this morning yet. She reports she tried to pump with hand pump and did not get anything. She was agreeable to me setting up DEBP. She reports she does not like the electric pump because it changes sucking patterns. Told her this is to simulate an infant's sucking pattern and to stimulate milk to come in . Mom did begin pumping while I was in the room, she was pumping when I left.   Enc mom to pump every 2-3 hours for 15 minutes. Reviewed pumping chart in Providing Milk for Your Baby in NICU and reviewed normal progression of milk coming to volume. Mom did not want to be shown hand expression as she says she knows how to do that.   Mom is a [facility] client. She does not have a breast pump at home, She reports she used a manual pump for other children. Advised mom that with infants in NICU, BM will be benefit to the babies.   Set up DEBP for mom with instructions for set up, assembling, disassembling, and cleaning of pump parts. BM Storage guidelines reviewed with mom. Will follow up tomorrow and prn.   Maternal Data Has patient been taught Hand Expression?:  (Mom declined and said she know how) Does the patient have breastfeeding experience prior to this delivery?: Yes  Feeding    LATCH Score/Interventions                      Lactation Tools Discussed/Used WIC Program: Yes Pump Review: Setup, frequency, and cleaning;Milk Storage Initiated by:: Nonah Mattes, RN, IBCLC Date initiated:: 12/06/15   Consult Status Consult Status: Follow-up Date: 12/07/15 Follow-up type: In-patient    [name] [name] 12/06/2015, 10:23 AM

## 2015-12-07 LAB — RUBELLA SCREEN: RUBELLA: 1.41 {index} (ref >0.99)

## 2015-12-07 NOTE — Lactation Note (Signed)
This note was copied from a baby's chart. Lactation Consultation Note  Patient Name: Boy Waylon Koffler RCVEL'F Date: 12/07/2015 Reason for consult: Follow-up assessment;NICU baby;Infant < 6lbs;Multiple gestation   Follow up with mom of 57 hour old twins in NICU. Mom reports she is pumping some and not getting any colostrum. She decline [facility] loaner pump. Was shown how to manually double pump using pump parts from pump kit. She also has a single manual pump. Mom has [place] appt on 5/31 @ 1 pm.   Discussed supply and demand and importance of pumping regularly, enc mom to pump using DEBP when visiting NICU. Engorgement prevention/treatment reviewed. [facility] Services reviewed and enc mom to call with questions/concerns prn.    Maternal Data    Feeding Feeding Type: Formula Length of feed: 30 min  LATCH Score/Interventions                      Lactation Tools Discussed/Used WIC Program: Yes (Appt 5/31 @ 1 pm) Pump Review: Setup, frequency, and cleaning;Milk Storage   Consult Status Consult Status: PRN Follow-up type: Call as needed    Donn Pierini 12/07/2015, 10:50 AM

## 2015-12-07 NOTE — Progress Notes (Signed)
Pt ambulate with family to nicu to see babies prior to leaving hospital

## 2015-12-07 NOTE — Discharge Instructions (Signed)
Discharge instructions   You can wash your hair  Shower  Eat what you want  Drink what you want  See me in 6 weeks  Your ankles are going to swell more in the next 2 weeks than when pregnant  No sex for 6 weeks   Aricela Bertagnolli A, MD 12/07/2015  Discharge instructions   You can wash your hair  Shower  Eat what you want  Drink what you want  See me in 6 weeks  Your ankles are going to swell more in the next 2 weeks than when pregnant  No sex for 6 weeks   Jamariya Davidoff A, MD 12/07/2015

## 2015-12-07 NOTE — Discharge Summary (Signed)
Obstetric Discharge Summary Reason for Admission: onset of labor Prenatal Procedures: none Intrapartum Procedures: spontaneous vaginal delivery Postpartum Procedures: none Complications-Operative and Postpartum: none HEMOGLOBIN  Date Value Ref Range Status  12/06/2015 11.5* 12.0 - 15.0 g/dL Final   HCT  Date Value Ref Range Status  12/06/2015 34.2* 36.0 - 46.0 % Final    Physical Exam:  General: alert Lochia: appropriate Uterine Fundus: firm Incision: healing well DVT Evaluation: No evidence of DVT seen on physical exam.  Discharge Diagnoses: Premature labor  Discharge Information: Date: 12/07/2015 Activity: pelvic rest Diet: routine Medications: Tylenol #3 Condition: improved Instructions: refer to practice specific booklet Discharge to: home Follow-up Information    Follow up with Frederico Hamman, MD.   Specialty:  Obstetrics and Gynecology   Contact information:   [address] [place] [address] [phone]       Follow up with Frederico Hamman, MD.   Specialty:  Obstetrics and Gynecology   Contact information:   [address] [place] [address] [phone]       Newborn Data:   Kassity, Croushore [id number]  Live born female  Birth Weight: 3 lb 7.4 oz (1570 g) APGAR: 6, 8   Lauretta, Brittan [id number]  Live born female  Birth Weight: 3 lb 2.1 oz (1420 g) APGAR: 6, 8  Home with babies in nicu.  Leno Mathes A 12/07/2015, 8:26 AM

## 2015-12-07 NOTE — Progress Notes (Signed)
Teaching complete 

## 2015-12-07 NOTE — Discharge Summary (Signed)
Obstetric Discharge Summary Reason for Admission: onset of labor Prenatal Procedures: none Intrapartum Procedures: spontaneous vaginal delivery Postpartum Procedures: none Complications-Operative and Postpartum: none HEMOGLOBIN  Date Value Ref Range Status  12/06/2015 11.5* 12.0 - 15.0 g/dL Final   HCT  Date Value Ref Range Status  12/06/2015 34.2* 36.0 - 46.0 % Final    Physical Exam:  General: alert Lochia: appropriate Uterine Fundus: firm Incision: healing well DVT Evaluation: No evidence of DVT seen on physical exam.  Discharge Diagnoses: Premature labor  Discharge Information: Date: 12/07/2015 Activity: pelvic rest Diet: routine Medications: None Condition: improved Instructions: refer to practice specific booklet Discharge to: home Follow-up Information    Follow up with Frederico Hamman, MD.   Specialty:  Obstetrics and Gynecology   Contact information:   [address] [place] [address] [phone]       Newborn Data:   Pearlena, Hennigh [id number]  Live born female  Birth Weight: 3 lb 7.4 oz (1570 g) APGAR: 6, 8   Lisabella, Pao [id number]  Live born female  Birth Weight: 3 lb 2.1 oz (1420 g) APGAR: 6, 8  Home with babies in nicu.  MARSHALL,BERNARD A 12/07/2015, 6:58 AM

## 2015-12-07 NOTE — Progress Notes (Signed)
Patient ID: Breanna Yates, female   DOB: 19-Apr-1986, 30 y.o.   MRN: [id number] Postpartum day 2 Blood pressure 117/59 pulse 84 respiration 16 No complaints fundus firm Lochia moderate legs negative home today

## 2015-12-09 ENCOUNTER — Ambulatory Visit: Payer: Self-pay

## 2015-12-09 NOTE — Lactation Note (Addendum)
This note was copied from a baby's chart. Lactation Consultation Note  Patient Name: Breanna Yates Date: 12/09/2015 Reason for consult: Follow-up assessment;NICU baby;Infant < 6lbs;Multiple gestation   Met with mom in NICU as she is holding both babies STS. Mom requesting tubing for breast pump as she left hers at the hospital. She reports she is pumping with a hand pump and getting up to 90 cc/pumping. Referred her to [facility] Store to purchase pieces she needs. Mom is planning to get a WIC pump later this week.   Mom asking if she needs to eat or drink anything special to produce BM. Advised her to drink to thirst and to take in about 500 extra calories a day for producing milk. Mom voiced understanding.    Maternal Data    Feeding Feeding Type: Formula Length of feed: 45 min  LATCH Score/Interventions                      Lactation Tools Discussed/Used WIC Program: Yes Pump Review: Setup, frequency, and cleaning   Consult Status Consult Status: PRN Follow-up type: Call as needed    Donn Pierini 12/09/2015, 2:01 PM

## 2015-12-16 ENCOUNTER — Ambulatory Visit (HOSPITAL_COMMUNITY): Payer: Medicaid Other

## 2015-12-23 ENCOUNTER — Ambulatory Visit (HOSPITAL_COMMUNITY): Payer: Medicaid Other

## 2015-12-30 ENCOUNTER — Ambulatory Visit (HOSPITAL_COMMUNITY): Payer: Medicaid Other

## 2016-01-06 ENCOUNTER — Ambulatory Visit (HOSPITAL_COMMUNITY): Payer: Medicaid Other

## 2016-01-26 ENCOUNTER — Encounter: Payer: Self-pay | Admitting: Obstetrics and Gynecology

## 2016-01-26 ENCOUNTER — Ambulatory Visit (INDEPENDENT_AMBULATORY_CARE_PROVIDER_SITE_OTHER): Payer: Medicaid Other | Admitting: Obstetrics and Gynecology

## 2016-01-26 VITALS — BP 153/97 | HR 77 | Ht 67.5 in | Wt 215.0 lb

## 2016-01-26 DIAGNOSIS — O165 Unspecified maternal hypertension, complicating the puerperium: Secondary | ICD-10-CM | POA: Insufficient documentation

## 2016-01-26 DIAGNOSIS — Z302 Encounter for sterilization: Secondary | ICD-10-CM

## 2016-01-26 DIAGNOSIS — O139 Gestational [pregnancy-induced] hypertension without significant proteinuria, unspecified trimester: Secondary | ICD-10-CM

## 2016-01-26 DIAGNOSIS — Z8632 Personal history of gestational diabetes: Secondary | ICD-10-CM

## 2016-01-26 DIAGNOSIS — D069 Carcinoma in situ of cervix, unspecified: Secondary | ICD-10-CM

## 2016-01-26 DIAGNOSIS — Z3009 Encounter for other general counseling and advice on contraception: Secondary | ICD-10-CM

## 2016-01-26 MED ORDER — HYDROCHLOROTHIAZIDE 12.5 MG PO TABS: 25.0000 mg | ORAL_TABLET | Freq: Every day | ORAL | Status: DC

## 2016-01-26 NOTE — Progress Notes (Signed)
Patient states that she is here to sign tubal papers and have surgical consult for tubal. Kathrene Alu RN BSN

## 2016-01-26 NOTE — Progress Notes (Signed)
Obstetrics and Gynecology Visit Established Patient Evaluation  Appointment Date: 01/26/2016  OBGYN Clinic: [facility]  Primary Care Provider: No primary care provider on file.  Chief Complaint: desire for BTL  History of Present Illness: Breanna Yates is a 30 y.o. African-American [phone] (Patient's last menstrual period was 01/11/2016.), seen for the above chief complaint. Her past medical history is significant for BMI 33, h/o chlamydia, h/o conization for CIN 3, h/o GDM  Menses x 2 since delivery. Patient using abstinence for [facility]. Would like BTL. All children healthy and doing well. No s/s of pre-eclampsia   Review of Systems:  Her 12 point review of systems is negative or as noted in the History of Present Illness.  Past Medical History:  Past Medical History  Diagnosis Date  . Chlamydia 2008  . CIN III (cervical intraepithelial neoplasia grade III) with severe dysplasia   . History of gestational diabetes mellitus (GDM)   . GDM (gestational diabetes mellitus) 11/26/2015    Past Surgical History:  Past Surgical History  Procedure Laterality Date  . Wisdom tooth extraction    . Tongue surgery    . Cervical conization w/bx N/A 09/18/2013    Procedure: CONIZATION CERVIX;  Surgeon: Frederico Hamman, MD;  Location: [facility];  Service: Gynecology;  Laterality: N/A;    Past Obstetrical [id number]. SVD x 4  Past Gynecological History: As per HPI. CIN3 with negative margins on 09/2013 cone. Op note doesn't state if CKC or LEEP. Patient unsure of any paps after and no mention in chart.  Social History:  Social History   Social History  . Marital Status: Single    Spouse Name: N/A  . Number of Children: N/A  . Years of Education: N/A   Occupational History  . Not on file.   Social History Main Topics  . Smoking status: Former Smoker -- 0.00 packs/day for 13 years    Types: Cigarettes  . Smokeless tobacco: Never Used  . Alcohol Use: Yes     Comment: occ but not while  preg  . Drug Use: No  . Sexual Activity: Yes    Birth Control/ Protection: None   Other Topics Concern  . Not on file   Social History Narrative    Family History:  Family History  Problem Relation Age of Onset  . Hypertension Mother   . Asthma Sister   . Cancer Sister     brain tumor and seizures  . Hypertension Maternal Grandmother   . Diabetes Maternal Grandmother     Medications: none Allergies Pineapple and Mango flavor   Physical Exam:  BP 153/97 mmHg  Pulse 77  Ht 5' 7.5" (1.715 m)  Wt 215 lb (97.523 kg)  BMI 33.16 kg/m2  LMP 01/11/2016 Body mass index is 33.16 kg/(m^2). General appearance: Well nourished, well developed female in no acute distress.  Neck:  Supple, normal appearance, and no thyromegaly  Cardiovascular: normal s1 and s2.  No murmurs, rubs or gallops. Respiratory:  Clear to auscultation bilateral. Normal respiratory effort Abdomen: positive bowel sounds and no masses, hernias; diffusely non tender to palpation, non distended Neuro/Psych:  Normal mood and affect. 1+ brachial Skin:  Warm and dry.  Lymphatic:  No inguinal lymphadenopathy.   Pelvic exam: is not limited by body habitus EGBUS: within normal limits Vagina: within normal limits and with no blood in the vault, Cervix:  no lesions or cervical motion tenderness Uterus:  nonenlarged and approximately 8 week sized Adnexa:  normal adnexa and no  mass, fullness, tenderness Rectovaginal: deferred  Laboratory: none  Radiology: non  Assessment: GHTN, h/o abnormal pap, desire for permanent sterilization. Pt stable  Plan:  *Contraception: d/w pt r/b/a including risk of regret, failure and no change in cycles (which sound normal and not an issue anyways), surgical risks with pt and she would like to do BTL. She states that she signed papers in April but no paper chart available so re-signed today. Will send request and schedule with next available provider as patient doesn't have preference.   *HTN: no s/s of pre-x. Unable to draw blood due to hard stick. Will follow up BP at nv when she needs RN BP check and 2hr GTT given h/o recent GDM. Pt told of need for finding PCP in the interim. Precautions given *h/o CIN 3: pap and HPV testing done today  RTC 2wks for RN BP check.   Durene Romans MD Attending Center for [employer] farm manager)

## 2016-01-27 LAB — PROTEIN / CREATININE RATIO, URINE
Creatinine, Urine: 160.2 mg/dL
PROTEIN UR: 18.5 mg/dL
PROTEIN/CREAT RATIO: 115 mg/g{creat} (ref 0–200)

## 2016-01-28 LAB — PAP IG, RFX HPV ALL PTH: PAP Smear Comment: 0

## 2016-02-01 ENCOUNTER — Encounter (HOSPITAL_COMMUNITY): Payer: Self-pay | Admitting: *Deleted

## 2016-02-02 ENCOUNTER — Telehealth: Payer: Self-pay

## 2016-02-02 NOTE — Telephone Encounter (Signed)
Left message for patient to return call to the office.   Patient needs to come back to office a resign Medicaid tubal papers. The ones she signed had an error on them. Kathrene Alu RN BSN

## 2016-02-10 ENCOUNTER — Encounter (HOSPITAL_COMMUNITY): Payer: Self-pay | Admitting: Obstetrics and Gynecology

## 2016-02-11 ENCOUNTER — Other Ambulatory Visit: Payer: Medicaid Other

## 2016-02-16 ENCOUNTER — Inpatient Hospital Stay (HOSPITAL_COMMUNITY)
Admission: RE | Admit: 2016-02-16 | Discharge: 2016-02-16 | Disposition: A | Discharge status: Home / Self Care | Payer: Medicaid Other | Source: Ambulatory Visit

## 2016-02-22 ENCOUNTER — Encounter (HOSPITAL_COMMUNITY): Payer: Self-pay | Admitting: *Deleted

## 2016-02-22 ENCOUNTER — Other Ambulatory Visit: Payer: Medicaid Other

## 2016-02-23 ENCOUNTER — Encounter (HOSPITAL_COMMUNITY)
Admission: RE | Admit: 2016-02-23 | Discharge: 2016-02-23 | Disposition: A | Discharge status: Home / Self Care | Payer: Medicaid Other | Source: Ambulatory Visit | Attending: Obstetrics & Gynecology | Admitting: Obstetrics & Gynecology

## 2016-02-23 ENCOUNTER — Encounter (HOSPITAL_COMMUNITY): Payer: Self-pay

## 2016-02-23 DIAGNOSIS — Z01812 Encounter for preprocedural laboratory examination: Secondary | ICD-10-CM | POA: Diagnosis present

## 2016-02-23 HISTORY — DX: Depression, unspecified: F32.A

## 2016-02-23 HISTORY — DX: Essential (primary) hypertension: I10

## 2016-02-23 HISTORY — DX: Major depressive disorder, single episode, unspecified: F32.9

## 2016-02-23 LAB — CBC
HEMATOCRIT: 37.1 % (ref 36.0–46.0)
Hemoglobin: 12.3 g/dL (ref 12.0–15.0)
MCH: 27.1 pg (ref 26.0–34.0)
MCHC: 33.2 g/dL (ref 30.0–36.0)
MCV: 81.7 fL (ref 78.0–100.0)
PLATELETS: 307 10*3/uL (ref 150–400)
RBC: 4.54 MIL/uL (ref 3.87–5.11)
RDW: 14.2 % (ref 11.5–15.5)
WBC: 6.1 10*3/uL (ref 4.0–10.5)

## 2016-02-23 LAB — BASIC METABOLIC PANEL
Anion gap: 5 (ref 5–15)
BUN: 12 mg/dL (ref 6–20)
CALCIUM: 9 mg/dL (ref 8.9–10.3)
CO2: 26 mmol/L (ref 22–32)
Chloride: 106 mmol/L (ref 101–111)
Creatinine, Ser: 0.79 mg/dL (ref 0.44–1.00)
GFR calc Af Amer: >60 mL/min (ref >60)
GLUCOSE: 93 mg/dL (ref 65–99)
POTASSIUM: 3.9 mmol/L (ref 3.5–5.1)
Sodium: 137 mmol/L (ref 135–145)

## 2016-02-23 NOTE — Patient Instructions (Signed)
Your procedure is scheduled on:03/01/16  Enter through the Main Entrance at :1:45 pm Pick up desk phone and dial [phone] and inform us of your arrival.  Please call [phone] if you have any problems the morning of surgery.  Remember: Do not eat food after midnight: Monday Clear liquids are ok until:11 am on Tuesday   You may brush your teeth the morning of surgery.  Take these meds the morning of surgery with a sip of water:BP pill  DO NOT wear jewelry, eye make-up, lipstick,body lotion, or dark fingernail polish.  (Polished toes are ok) You may wear deodorant.  If you are to be admitted after surgery, leave suitcase in car until your room has been assigned. Patients discharged on the day of surgery will not be allowed to drive home. Wear loose fitting, comfortable clothes for your ride home.  Your procedure is scheduled on:  Enter through the Main Entrance at : Pick up desk phone and dial [phone] and inform us of your arrival.  Please call [phone] if you have any problems the morning of surgery.  Remember: Do not eat food or drink liquids, including water, after midnight: Clear liquids are ok until:   You may brush your teeth the morning of surgery.  Take these meds the morning of surgery with a sip of water:  DO NOT wear jewelry, eye make-up, lipstick,body lotion, or dark fingernail polish.  (Polished toes are ok) You may wear deodorant.  If you are to be admitted after surgery, leave suitcase in car until your room has been assigned. Patients discharged on the day of surgery will not be allowed to drive home. Wear loose fitting, comfortable clothes for your ride home.

## 2016-02-26 ENCOUNTER — Encounter (HOSPITAL_COMMUNITY): Payer: Self-pay | Admitting: Obstetrics and Gynecology

## 2016-03-01 ENCOUNTER — Encounter (HOSPITAL_COMMUNITY): Payer: Self-pay

## 2016-03-01 ENCOUNTER — Ambulatory Visit (HOSPITAL_COMMUNITY)
Admission: RE | Admit: 2016-03-01 | Discharge: 2016-03-01 | Disposition: A | Discharge status: Home / Self Care | Payer: Medicaid Other | Source: Ambulatory Visit | Attending: Obstetrics & Gynecology | Admitting: Obstetrics & Gynecology

## 2016-03-01 ENCOUNTER — Ambulatory Visit (HOSPITAL_COMMUNITY): Payer: Medicaid Other | Admitting: Anesthesiology

## 2016-03-01 ENCOUNTER — Encounter (HOSPITAL_COMMUNITY)
Admission: RE | Disposition: A | Discharge status: Home / Self Care | Payer: Self-pay | Source: Ambulatory Visit | Attending: Obstetrics & Gynecology

## 2016-03-01 ENCOUNTER — Ambulatory Visit: Admit: 2016-03-01 | Payer: Medicaid Other | Admitting: Obstetrics & Gynecology

## 2016-03-01 DIAGNOSIS — I10 Essential (primary) hypertension: Secondary | ICD-10-CM | POA: Insufficient documentation

## 2016-03-01 DIAGNOSIS — Z302 Encounter for sterilization: Secondary | ICD-10-CM | POA: Diagnosis not present

## 2016-03-01 DIAGNOSIS — Z9851 Tubal ligation status: Secondary | ICD-10-CM

## 2016-03-01 DIAGNOSIS — F17210 Nicotine dependence, cigarettes, uncomplicated: Secondary | ICD-10-CM | POA: Insufficient documentation

## 2016-03-01 HISTORY — PX: LAPAROSCOPIC TUBAL LIGATION: SHX1937

## 2016-03-01 LAB — PREGNANCY, URINE: Preg Test, Ur: NEGATIVE

## 2016-03-01 SURGERY — LIGATION, FALLOPIAN TUBE, LAPAROSCOPIC
Anesthesia: Choice | Laterality: Bilateral

## 2016-03-01 SURGERY — LIGATION, FALLOPIAN TUBE, LAPAROSCOPIC
Anesthesia: General | Site: Abdomen

## 2016-03-01 MED ORDER — DEXAMETHASONE SODIUM PHOSPHATE 4 MG/ML IJ SOLN
INTRAMUSCULAR | Status: AC
  Filled 2016-03-01: qty 1

## 2016-03-01 MED ORDER — ROCURONIUM BROMIDE 100 MG/10ML IV SOLN
INTRAVENOUS | Status: DC | PRN
  Administered 2016-03-01: 30 mg via INTRAVENOUS
  Administered 2016-03-01: 10 mg via INTRAVENOUS

## 2016-03-01 MED ORDER — NEOSTIGMINE METHYLSULFATE 10 MG/10ML IV SOLN
INTRAVENOUS | Status: DC | PRN
  Administered 2016-03-01: 3.5 mg via INTRAVENOUS

## 2016-03-01 MED ORDER — MIDAZOLAM HCL 2 MG/2ML IJ SOLN
INTRAMUSCULAR | Status: AC
  Filled 2016-03-01: qty 2

## 2016-03-01 MED ORDER — FENTANYL CITRATE (PF) 100 MCG/2ML IJ SOLN
25.0000 ug | INTRAMUSCULAR | Status: DC | PRN
  Administered 2016-03-01 (×2): 50 ug via INTRAVENOUS

## 2016-03-01 MED ORDER — KETOROLAC TROMETHAMINE 30 MG/ML IJ SOLN
INTRAMUSCULAR | Status: AC
  Filled 2016-03-01: qty 1

## 2016-03-01 MED ORDER — FENTANYL CITRATE (PF) 100 MCG/2ML IJ SOLN
INTRAMUSCULAR | Status: AC
  Filled 2016-03-01: qty 2

## 2016-03-01 MED ORDER — PROPOFOL 10 MG/ML IV BOLUS
INTRAVENOUS | Status: AC
  Filled 2016-03-01: qty 20

## 2016-03-01 MED ORDER — BUPIVACAINE HCL (PF) 0.5 % IJ SOLN
INTRAMUSCULAR | Status: DC | PRN
  Administered 2016-03-01: 20 mL
  Administered 2016-03-01: 10 mL

## 2016-03-01 MED ORDER — ROCURONIUM BROMIDE 100 MG/10ML IV SOLN
INTRAVENOUS | Status: AC
  Filled 2016-03-01: qty 1

## 2016-03-01 MED ORDER — GLYCOPYRROLATE 0.2 MG/ML IJ SOLN
INTRAMUSCULAR | Status: DC | PRN
  Administered 2016-03-01: 0.6 mg via INTRAVENOUS

## 2016-03-01 MED ORDER — OXYCODONE-ACETAMINOPHEN 5-325 MG PO TABS: 1.0000 | ORAL_TABLET | Freq: Four times a day (QID) | ORAL | 0 refills | Status: DC | PRN

## 2016-03-01 MED ORDER — LIDOCAINE HCL (CARDIAC) 20 MG/ML IV SOLN
INTRAVENOUS | Status: DC | PRN
  Administered 2016-03-01: 100 mg via INTRAVENOUS

## 2016-03-01 MED ORDER — BUPIVACAINE HCL (PF) 0.5 % IJ SOLN
INTRAMUSCULAR | Status: AC
  Filled 2016-03-01: qty 30

## 2016-03-01 MED ORDER — FENTANYL CITRATE (PF) 100 MCG/2ML IJ SOLN
INTRAMUSCULAR | Status: DC | PRN
  Administered 2016-03-01: 25 ug via INTRAVENOUS
  Administered 2016-03-01 (×3): 50 ug via INTRAVENOUS
  Administered 2016-03-01: 25 ug via INTRAVENOUS

## 2016-03-01 MED ORDER — DEXAMETHASONE SODIUM PHOSPHATE 10 MG/ML IJ SOLN
INTRAMUSCULAR | Status: DC | PRN
  Administered 2016-03-01: 8 mg via INTRAVENOUS

## 2016-03-01 MED ORDER — ARTIFICIAL TEARS OP OINT
TOPICAL_OINTMENT | OPHTHALMIC | Status: AC
  Filled 2016-03-01: qty 3.5

## 2016-03-01 MED ORDER — KETOROLAC TROMETHAMINE 30 MG/ML IJ SOLN
INTRAMUSCULAR | Status: DC | PRN
  Administered 2016-03-01: 30 mg via INTRAVENOUS

## 2016-03-01 MED ORDER — MIDAZOLAM HCL 2 MG/2ML IJ SOLN
INTRAMUSCULAR | Status: DC | PRN
  Administered 2016-03-01: 2 mg via INTRAVENOUS

## 2016-03-01 MED ORDER — LIDOCAINE HCL (CARDIAC) 20 MG/ML IV SOLN
INTRAVENOUS | Status: AC
  Filled 2016-03-01: qty 5

## 2016-03-01 MED ORDER — NEOSTIGMINE METHYLSULFATE 10 MG/10ML IV SOLN
INTRAVENOUS | Status: AC
  Filled 2016-03-01: qty 1

## 2016-03-01 MED ORDER — PROPOFOL 10 MG/ML IV BOLUS
INTRAVENOUS | Status: DC | PRN
  Administered 2016-03-01: 200 mg via INTRAVENOUS

## 2016-03-01 MED ORDER — IBUPROFEN 600 MG PO TABS: 600.0000 mg | ORAL_TABLET | Freq: Four times a day (QID) | ORAL | 3 refills | Status: DC | PRN

## 2016-03-01 MED ORDER — SCOPOLAMINE 1 MG/3DAYS TD PT72
MEDICATED_PATCH | TRANSDERMAL | Status: AC
  Administered 2016-03-01: 1.5 mg via TRANSDERMAL
  Filled 2016-03-01: qty 1

## 2016-03-01 MED ORDER — PROMETHAZINE HCL 25 MG/ML IJ SOLN: 6.2500 mg | INTRAMUSCULAR | Status: DC | PRN

## 2016-03-01 MED ORDER — LACTATED RINGERS IV SOLN
INTRAVENOUS | Status: DC
  Administered 2016-03-01: 125 mL/h via INTRAVENOUS
  Administered 2016-03-01: 15:00:00 via INTRAVENOUS

## 2016-03-01 MED ORDER — GLYCOPYRROLATE 0.2 MG/ML IJ SOLN
INTRAMUSCULAR | Status: AC
  Filled 2016-03-01: qty 3

## 2016-03-01 MED ORDER — ONDANSETRON HCL 4 MG/2ML IJ SOLN
INTRAMUSCULAR | Status: AC
  Filled 2016-03-01: qty 2

## 2016-03-01 MED ORDER — SCOPOLAMINE 1 MG/3DAYS TD PT72
1.0000 | MEDICATED_PATCH | Freq: Once | TRANSDERMAL | Status: DC
  Administered 2016-03-01: 1.5 mg via TRANSDERMAL

## 2016-03-01 MED ORDER — ONDANSETRON HCL 4 MG/2ML IJ SOLN
INTRAMUSCULAR | Status: DC | PRN
Start: 2016-03-01 — End: 2016-03-01
  Administered 2016-03-01: 4 mg via INTRAVENOUS

## 2016-03-01 SURGICAL SUPPLY — 27 items
BAG SPEC RTRVL LRG 6X4 10 (ENDOMECHANICALS)
CABLE HIGH FREQUENCY MONO STRZ (ELECTRODE) IMPLANT
CATH ROBINSON RED A/P 16FR (CATHETERS) ×3 IMPLANT
CLIP FILSHIE TUBAL LIGA STRL (Clip) ×2 IMPLANT
CLOTH BEACON ORANGE TIMEOUT ST (SAFETY) ×3 IMPLANT
DRSG COVADERM PLUS 2X2 (GAUZE/BANDAGES/DRESSINGS) ×2 IMPLANT
DRSG OPSITE POSTOP 3X4 (GAUZE/BANDAGES/DRESSINGS) ×2 IMPLANT
DURAPREP 26ML APPLICATOR (WOUND CARE) ×3 IMPLANT
GLOVE BIOGEL PI IND STRL 7.0 (GLOVE) ×6 IMPLANT
GLOVE BIOGEL PI INDICATOR 7.0 (GLOVE) ×3
GLOVE ECLIPSE 7.0 STRL STRAW (GLOVE) ×3 IMPLANT
GOWN STRL REUS W/TWL LRG LVL3 (GOWN DISPOSABLE) ×9 IMPLANT
NEEDLE INSUFFLATION 120MM (ENDOMECHANICALS) ×2 IMPLANT
NS IRRIG 1000ML POUR BTL (IV SOLUTION) ×3 IMPLANT
PACK LAPAROSCOPY BASIN (CUSTOM PROCEDURE TRAY) ×3 IMPLANT
PAD TRENDELENBURG POSITION (MISCELLANEOUS) ×3 IMPLANT
POUCH SPECIMEN RETRIEVAL 10MM (ENDOMECHANICALS) IMPLANT
SET IRRIG TUBING LAPAROSCOPIC (IRRIGATION / IRRIGATOR) IMPLANT
SHEARS HARMONIC ACE PLUS 36CM (ENDOMECHANICALS) IMPLANT
SLEEVE XCEL OPT CAN 5 100 (ENDOMECHANICALS) IMPLANT
SUT VICRYL 0 UR6 27IN ABS (SUTURE) ×3 IMPLANT
SUT VICRYL 4-0 PS2 18IN ABS (SUTURE) ×3 IMPLANT
TOWEL OR 17X24 6PK STRL BLUE (TOWEL DISPOSABLE) ×6 IMPLANT
TROCAR XCEL NON-BLD 11X100MML (ENDOMECHANICALS) ×3 IMPLANT
TROCAR XCEL NON-BLD 5MMX100MML (ENDOMECHANICALS) ×3 IMPLANT
WARMER LAPAROSCOPE (MISCELLANEOUS) ×3 IMPLANT
WATER STERILE IRR 1000ML POUR (IV SOLUTION) ×1 IMPLANT

## 2016-03-01 NOTE — Anesthesia Procedure Notes (Addendum)
Procedure Name: Intubation Date/Time: 03/01/2016 2:09 PM Performed by: Georgeanne Nim Pre-anesthesia Checklist: Patient identified, Emergency Drugs available, Suction available, Patient being monitored and Timeout performed Patient Re-evaluated:Patient Re-evaluated prior to inductionOxygen Delivery Method: Circle system utilized Preoxygenation: Pre-oxygenation with 100% oxygen Intubation Type: IV induction Ventilation: Mask ventilation without difficulty Laryngoscope Size: Mac and 3 Grade View: Grade I Tube type: Oral Number of attempts: 1 Airway Equipment and Method: Stylet Placement Confirmation: positive ETCO2,  ETT inserted through vocal cords under direct vision,  CO2 detector and breath sounds checked- equal and bilateral Secured at: 21 cm Tube secured with: Tape Dental Injury: Teeth and Oropharynx as per pre-operative assessment

## 2016-03-01 NOTE — Op Note (Signed)
Breanna Yates 03/01/2016  PREOPERATIVE DIAGNOSIS:  Undesired fertility  POSTOPERATIVE DIAGNOSIS:  Undesired fertility  PROCEDURE:  Laparoscopic Bilateral Tubal Sterilization using Filshie Clips   SURGEON: Verita Schneiders, MD  ANESTHESIA:  General endotracheal  COMPLICATIONS:  None immediate.  ESTIMATED BLOOD LOSS:  3 ml.  INDICATIONS: 30 y.o. [id number]  with undesired fertility, desires permanent sterilization. Other reversible forms of contraception were discussed with patient; she declines all other modalities.  Risks of procedure discussed with patient including permanence of method, bleeding, infection, injury to surrounding organs and need for additional procedures including laparotomy, risk of regret.  Failure risk of 0.5-1% with increased risk of ectopic gestation if pregnancy occurs was also discussed with patient.      FINDINGS:  Normal uterus, tubes, and ovaries.  TECHNIQUE:  The patient was taken to the operating room where general anesthesia was obtained without difficulty.  She was then placed in the dorsal lithotomy position and prepared and draped in sterile fashion.  After an adequate timeout was performed, a bivalved speculum was then placed in the patient's vagina, and the anterior lip of cervix grasped with the single-tooth tenaculum.  The uterine manipulator was then advanced into the uterus.  The speculum was removed from the vagina.  Attention was then turned to the patient's abdomen where a 11-mm skin incision was made in the umbilical fold.  The Optiview 11-mm trocar and sleeve were then advanced without difficulty with the laparoscope under direct visualization into the abdomen.  The abdomen was then insufflated with carbon dioxide gas and adequate pneumoperitoneum was obtained.  A survey of the patient's pelvis and abdomen revealed entirely normal anatomy.  The fallopian tubes were observed and found to be normal in appearance. The Filshie clip applicator was placed  through the operative port, and a Filshie clip was placed on the right fallopian tube ,about 2 cm from the cornual attachment, with care given to incorporate the underlying mesosalpinx.  A similar process was carried out on the contralateral side allowing for bilateral tubal sterilization.   Good hemostasis was noted overall.  Local analgesia was drizzled on both operative sites.The instruments were then removed from the patient's abdomen and the fascial incision was repaired with 0 Vicryl, and the skin was closed with 4-0 Monocryl.  The uterine manipulator and the tenaculum were removed from the vagina without complications. The patient tolerated the procedure well.  Sponge, lap, and needle counts were correct times two.  The patient was then taken to the recovery room awake, extubated and in stable condition.   Verita Schneiders, MD, [place] Attending [place], [facility] for [employer], [place]

## 2016-03-01 NOTE — Anesthesia Preprocedure Evaluation (Signed)
Anesthesia Evaluation  Patient identified by MRN, date of birth, ID band Patient awake    Reviewed: Allergy & Precautions, NPO status , Patient's Chart, lab work & pertinent test results  History of Anesthesia Complications Negative for: history of anesthetic complications  Airway Mallampati: I  TM Distance: >3 FB Neck ROM: Full    Dental  (+) Dental Advisory Given, Teeth Intact   Pulmonary neg shortness of breath, neg sleep apnea, neg COPD, neg recent URI, Current Smoker,    Pulmonary exam normal breath sounds clear to auscultation       Cardiovascular hypertension, Pt. on medications (-) angina(-) Past MI, (-) Cardiac Stents and (-) CABG  Rhythm:Regular Rate:Normal     Neuro/Psych neg Seizures negative neurological ROS     GI/Hepatic negative GI ROS, Neg liver ROS, neg GERD  ,  Endo/Other  diabetes, Gestational  Renal/GU negative Renal ROS     Musculoskeletal   Abdominal (+) + obese,   Peds  Hematology negative hematology ROS (+)   Anesthesia Other Findings   Reproductive/Obstetrics                             Anesthesia Physical Anesthesia Plan  ASA: II  Anesthesia Plan: General   Post-op Pain Management:    Induction: Intravenous  Airway Management Planned: Oral ETT  Additional Equipment:   Intra-op Plan:   Post-operative Plan: Extubation in OR  Informed Consent: I have reviewed the patients History and Physical, chart, labs and discussed the procedure including the risks, benefits and alternatives for the proposed anesthesia with the patient or authorized representative who has indicated his/her understanding and acceptance.   Dental advisory given  Plan Discussed with:   Anesthesia Plan Comments:         Anesthesia Quick Evaluation

## 2016-03-01 NOTE — Transfer of Care (Signed)
Immediate Anesthesia Transfer of Care Note  Patient: Breanna Yates  Procedure(s) Performed: Procedure(s): LAPAROSCOPIC TUBAL LIGATION WITH FILSHIE CLIPS (N/A)  Patient Location: PACU  Anesthesia Type:General  Level of Consciousness: awake and patient cooperative  Airway & Oxygen Therapy: Patient Spontanous Breathing and Patient connected to nasal cannula oxygen  Post-op Assessment: Report given to RN and Post -op Vital signs reviewed and stable  Post vital signs: Reviewed and stable  Last Vitals:  Vitals:   03/01/16 1503 03/01/16 1508  BP: (!) 177/103 (!) 148/95  Pulse: 67   Resp: 20   Temp: 37 C     Last Pain:  Vitals:   03/01/16 1327  TempSrc: Oral      Patients Stated Pain Goal: 3 ([id number])  Complications: No apparent anesthesia complications

## 2016-03-01 NOTE — H&P (Signed)
Preoperative History and Physical  Breanna Yates is a 30 y.o. [phone] here for surgical management of undesired fertility.   No significant preoperative concerns.  Proposed surgery: Laparoscopic bilateral tubal sterilization using Filshie clips  Past Medical History:  Diagnosis Date  . Chlamydia 2008  . CIN III (cervical intraepithelial neoplasia grade III) with severe dysplasia   . Depression    mild per pt- overwhelmed and tired  . GDM (gestational diabetes mellitus) 11/26/2015  . History of gestational diabetes mellitus (GDM)   . Hypertension    Past Surgical History:  Procedure Laterality Date  . CERVICAL CONIZATION W/BX N/A 09/18/2013   Procedure: CONIZATION CERVIX;  Surgeon: Frederico Hamman, MD;  Location: [facility];  Service: Gynecology;  Laterality: N/A;  . TONGUE SURGERY    . WISDOM TOOTH EXTRACTION     OB History  Gravida Para Term Preterm AB Living  3 3 1 2  0 3  SAB TAB Ectopic Multiple Live Births  0 0 0 1 3    # Outcome Date GA Lbr Len/2nd Weight Sex Delivery Anes PTL Lv  3A Preterm 12/05/15 [redacted]w[redacted]d  3 lb 7.4 oz (1.57 kg) M Vag-Spont None  LIV  3B Preterm 12/05/15 [redacted]w[redacted]d  3 lb 2.1 oz (1.42 kg) M Vag-Spont None  LIV  2 Preterm 02/11/15 100w6d         1 Term 01/12/12 [redacted]w[redacted]d 06:59 / 00:41 6 lb 15.6 oz (3.165 kg) F Vag-Spont EPI  LIV     Birth Comments: none    Patient denies any other pertinent gynecologic issues.   No current facility-administered medications on file prior to encounter.    Current Outpatient Prescriptions on File Prior to Encounter  Medication Sig Dispense Refill  . hydrochlorothiazide (HYDRODIURIL) 12.5 MG tablet Take 2 tablets (25 mg total) by mouth daily. 30 tablet 2   Allergies  Allergen Reactions  . Pineapple Itching    Oral itching, no swelling  . Mango Flavor Rash    Caused rash on face.    Social History:   reports that she has been smoking Cigarettes.  She has a 3.25 pack-year smoking history. She has never used smokeless tobacco.  She reports that she does not drink alcohol or use drugs.  Family History  Problem Relation Age of Onset  . Hypertension Mother   . Asthma Sister   . Cancer Sister     brain tumor and seizures  . Hypertension Maternal Grandmother   . Diabetes Maternal Grandmother     Review of Systems: Noncontributory  PHYSICAL EXAM: Last menstrual period 02/04/2016, not currently breastfeeding. CONSTITUTIONAL: Well-developed, well-nourished female in no acute distress.  HENT:  Normocephalic, atraumatic, External right and left ear normal. Oropharynx is clear and moist EYES: Conjunctivae and EOM are normal. Pupils are equal, round, and reactive to light. No scleral icterus.  NECK: Normal range of motion, supple, no masses SKIN: Skin is warm and dry. No rash noted. Not diaphoretic. No erythema. No pallor. NEUROLOGIC: Alert and oriented to person, place, and time. Normal reflexes, muscle tone coordination. No cranial nerve deficit noted. PSYCHIATRIC: Normal mood and affect. Normal behavior. Normal judgment and thought content. CARDIOVASCULAR: Normal heart rate noted, regular rhythm RESPIRATORY: Effort and breath sounds normal, no problems with respiration noted ABDOMEN: Soft, nontender, nondistended. PELVIC: Deferred MUSCULOSKELETAL: Normal range of motion. No edema and no tenderness. 2+ distal pulses.  Labs: Results for orders placed or performed during the hospital encounter of 02/23/16 (from the past 336 hour(s))  Basic metabolic  panel   Collection Time: 02/23/16 11:59 AM  Result Value Ref Range   Sodium 137 135 - 145 mmol/L   Potassium 3.9 3.5 - 5.1 mmol/L   Chloride 106 101 - 111 mmol/L   CO2 26 22 - 32 mmol/L   Glucose, Bld 93 65 - 99 mg/dL   BUN 12 6 - 20 mg/dL   Creatinine, Ser 0.79 0.44 - 1.00 mg/dL   Calcium 9.0 8.9 - 10.3 mg/dL   GFR calc non Af Amer >60 >60 mL/min   GFR calc Af Amer >60 >60 mL/min   Anion gap 5 5 - 15  CBC   Collection Time: 02/23/16 11:59 AM  Result Value Ref  Range   WBC 6.1 4.0 - 10.5 K/uL   RBC 4.54 3.87 - 5.11 MIL/uL   Hemoglobin 12.3 12.0 - 15.0 g/dL   HCT 37.1 36.0 - 46.0 %   MCV 81.7 78.0 - 100.0 fL   MCH 27.1 26.0 - 34.0 pg   MCHC 33.2 30.0 - 36.0 g/dL   RDW 14.2 11.5 - 15.5 %   Platelets 307 150 - 400 K/uL    Assessment: Patient desires permanent sterilization  Plan: Patient will undergo surgical management with laparoscopic bilateral tubal sterilization using Filshie clips.   The risks of surgery were discussed in detail with the patient including but not limited to: permanence of method; risk of regret; bleeding which may require transfusion or reoperation; infection which may require antibiotics; injury to surrounding organs; need for additional procedures including laparotomy; thromboembolic phenomenon, surgical site problems and other postoperative/anesthesia complications.  Failure risk of 0.5-1% with increased risk of ectopic gestation if pregnancy occurs was also discussed with patient.  Routine postoperative instructions will be reviewed with the patient and her family in detail after surgery.  The patient concurred with the proposed plan, giving informed written consent for the surgery.  Patient has been NPO since last night and she will remain NPO for procedure.  Anesthesia and OR aware.  Preoperative SCDs ordered on call to the OR.  To OR when ready.   Verita Schneiders, M.D. 03/01/2016 1:12 PM

## 2016-03-01 NOTE — Discharge Instructions (Addendum)
DISCHARGE INSTRUCTIONS: Laparoscopy  The following instructions have been prepared to help you care for yourself upon your return home today.  MAY REMOVE SCOP PATCH ON OR BEFORE 03/04/2016  MAY TAKE IBUPROFEN PRODUCTS AFTER 8:30 PM AS NEEDED FOR PAIN  USE A STOOL SOFTNER WHILE TAKE NARCOTICS.  DRINK PLENTY OF WATER.  Wound care:  Do not get the incision wet for the first 24 hours. The incision should be kept clean and dry.  The Band-Aids or dressings may be removed 48 hours after surgery.  Should the incision become sore, red, and swollen after the first week, check with your doctor.  Personal hygiene:  Shower the day after your procedure.  Activity and limitations:  Do NOT drive or operate any equipment today.  Do NOT lift anything more than 15 pounds for 2-3 weeks after surgery.  Do NOT rest in bed all day.  Walking is encouraged. Walk each day, starting slowly with 5-minute walks 3 or 4 times a day. Slowly increase the length of your walks.  Walk up and down stairs slowly.  Do NOT do strenuous activities, such as golfing, playing tennis, bowling, running, biking, weight lifting, gardening, mowing, or vacuuming for 2-4 weeks. Ask your doctor when it is okay to start.  Diet: Eat a light meal as desired this evening. You may resume your usual diet tomorrow.  Return to work: This is dependent on the type of work you do. For the most part you can return to a desk job within a week of surgery. If you are more active at work, please discuss this with your doctor.  What to expect after your surgery: You may have a slight burning sensation when you urinate on the first day. You may have a very small amount of blood in the urine. Expect to have a small amount of vaginal discharge/light bleeding for 1-2 weeks. It is not unusual to have abdominal soreness and bruising for up to 2 weeks. You may be tired and need more rest for about 1 week. You may experience shoulder pain for 24-72  hours. Lying flat in bed may relieve it.  Call your doctor for any of the following:  Develop a fever of 100.4 or greater  Inability to urinate 6 hours after discharge from hospital  Severe pain not relieved by pain medications  Persistent of heavy bleeding at incision site  Redness or swelling around incision site after a week  Increasing nausea or vomiting  Laparoscopic Tubal Ligation Laparoscopic tubal ligation is a procedure that closes the fallopian tubes at a time other than right after childbirth. By closing the fallopian tubes, the eggs that are released from the ovaries cannot enter the uterus and sperm cannot reach the egg. Tubal ligation is also known as getting your "tubes tied." Tubal ligation is done so you will not be able to get pregnant or have a baby.  Although this procedure may be reversed, it should be considered permanent and irreversible. If you want to have future pregnancies, you should not have this procedure.  RISKS AND COMPLICATIONS   Infection.  Bleeding.  Injury to surrounding organs.  Anesthetic side effects.  Failure of the procedure.  Ectopic pregnancy.  Future regret about having the procedure done. PROCEDURE   You may be given a medicine to help you relax (sedative) before the procedure. You will be given a medicine to make you sleep (general anesthetic) during the procedure.  A tube will be put down your throat to help  your breath while under general anesthesia.  One small cut (incision) will be made near the belly button.  Your abdominal area will be inflated with a safe gas (carbon dioxide). This helps give the surgeon room to operate, visualize, and helps the surgeon avoid other organs.  A thin, lighted tube (laparoscope) with a camera attached and other instruments are inserted into your abdomen through this incision  The fallopian tubes are located and are either blocked with a clip, or are burned (cauterized).  After the  fallopian tubes are blocked, the gas is released from the abdomen.  The incision will be closed with stitches (sutures), and Dermabond or a bandage may be placed over the incisions. AFTER THE PROCEDURE   You will rest in a recovery room for 1-4 hours until you are stable and doing well.  You will also have some mild abdominal discomfort for 3-7 days. You will be given pain medicine to ease any discomfort.  As long as there are no problems, you may be allowed to go home. Someone will need to drive you home and be with you for at least 24 hours once home.  You may have some mild discomfort in the throat. This is from the tube placed in your throat while you were sleeping.  You may experience discomfort in the shoulder area from some trapped air between the liver and diaphragm. This sensation is normal and will slowly go away on its own. ExitCare Patient Information 2013 [place].

## 2016-03-01 NOTE — Anesthesia Postprocedure Evaluation (Signed)
Anesthesia Post Note  Patient: Breanna Yates  Procedure(s) Performed: Procedure(s) (LRB): LAPAROSCOPIC TUBAL LIGATION WITH FILSHIE CLIPS (N/A)  Patient location during evaluation: PACU Anesthesia Type: General Level of consciousness: awake Pain management: pain level controlled Vital Signs Assessment: post-procedure vital signs reviewed and stable Respiratory status: spontaneous breathing Cardiovascular status: stable Postop Assessment: no signs of nausea or vomiting Anesthetic complications: no     Last Vitals:  Vitals:   03/01/16 1530 03/01/16 1545  BP: (!) 154/91 140/85  Pulse: 65 60  Resp: 15 15  Temp:      Last Pain:  Vitals:   03/01/16 1530  TempSrc:   PainSc: 8    Pain Goal: Patients Stated Pain Goal: 3 (03/01/16 1530)               Anselma Herbel JR,JOHN Mateo Flow

## 2016-03-02 ENCOUNTER — Encounter (HOSPITAL_COMMUNITY): Payer: Self-pay | Admitting: Obstetrics & Gynecology

## 2016-03-15 ENCOUNTER — Ambulatory Visit (INDEPENDENT_AMBULATORY_CARE_PROVIDER_SITE_OTHER): Payer: Medicaid Other | Admitting: Obstetrics and Gynecology

## 2016-03-15 ENCOUNTER — Encounter: Payer: Self-pay | Admitting: Obstetrics and Gynecology

## 2016-03-15 VITALS — BP 146/101 | HR 69 | Temp 98.3°F | Ht 67.0 in | Wt 212.6 lb

## 2016-03-15 DIAGNOSIS — Z9889 Other specified postprocedural states: Secondary | ICD-10-CM

## 2016-03-15 NOTE — Progress Notes (Signed)
30 yo [id number] presenting today for post op check. Patient underwent a laparoscopic bilateral tubal ligation on 03/01/2016. She reports  feeling well since the procedure. She denies fever, chills, abnormal drainage from incision or abdominal pain. She returned back to work 5 days ago without complications  Past Medical History:  Diagnosis Date  . Chlamydia 2008  . CIN III (cervical intraepithelial neoplasia grade III) with severe dysplasia   . Depression    mild per pt- overwhelmed and tired  . GDM (gestational diabetes mellitus) 11/26/2015  . History of gestational diabetes mellitus (GDM)   . Hypertension    Past Surgical History:  Procedure Laterality Date  . CERVICAL CONIZATION W/BX N/A 09/18/2013   Procedure: CONIZATION CERVIX;  Surgeon: Frederico Hamman, MD;  Location: [facility];  Service: Gynecology;  Laterality: N/A;  . LAPAROSCOPIC TUBAL LIGATION N/A 03/01/2016   Procedure: LAPAROSCOPIC TUBAL LIGATION WITH FILSHIE CLIPS;  Surgeon: Osborne Oman, MD;  Location: [facility];  Service: Gynecology;  Laterality: N/A;  . TONGUE SURGERY    . WISDOM TOOTH EXTRACTION     Family History  Problem Relation Age of Onset  . Hypertension Mother   . Asthma Sister   . Cancer Sister     brain tumor and seizures  . Hypertension Maternal Grandmother   . Diabetes Maternal Grandmother    Social History  Substance Use Topics  . Smoking status: Current Every Day Smoker    Packs/day: 0.25    Years: 13.00    Types: Cigarettes  . Smokeless tobacco: Never Used  . Alcohol use No   ROS See pertinent in HPI Blood pressure (!) 146/101, pulse 69, temperature 98.3 F (36.8 C), temperature source Oral, height 5\' 7"  (1.702 m), weight 212 lb 9.6 oz (96.4 kg), last menstrual period 03/03/2016, not currently breastfeeding.  GENERAL: Well-developed, well-nourished female in no acute distress.  ABDOMEN: Soft, nontender, nondistended. No organomegaly. Incision: no erythema, induration or drainage. Healing  well PELVIC: Not indicated EXTREMITIES: No cyanosis, clubbing, or edema, 2+ distal pulses.  A/P 30 yo here for post op check s/p lsc BTL - Patient is medically cleared to resume all activities of daily living - Continue HCTZ and follow up with PCP - RTC for annual exam in July or prn

## 2016-05-06 ENCOUNTER — Encounter: Payer: Self-pay | Admitting: *Deleted

## 2016-05-18 ENCOUNTER — Ambulatory Visit (HOSPITAL_COMMUNITY)
Admission: EM | Admit: 2016-05-18 | Discharge: 2016-05-18 | Disposition: A | Discharge status: Home / Self Care | Payer: Medicaid Other | Attending: Family Medicine | Admitting: Family Medicine

## 2016-05-18 ENCOUNTER — Encounter (HOSPITAL_COMMUNITY): Payer: Self-pay | Admitting: Emergency Medicine

## 2016-05-18 DIAGNOSIS — R51 Headache: Secondary | ICD-10-CM

## 2016-05-18 MED ORDER — IBUPROFEN 800 MG PO TABS: 800.0000 mg | ORAL_TABLET | Freq: Three times a day (TID) | ORAL | 1 refills | Status: DC
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
## 2016-08-15 ENCOUNTER — Encounter: Payer: Self-pay | Admitting: Obstetrics

## 2016-08-15 ENCOUNTER — Ambulatory Visit (INDEPENDENT_AMBULATORY_CARE_PROVIDER_SITE_OTHER): Payer: Medicaid Other | Admitting: Obstetrics

## 2016-08-15 DIAGNOSIS — I10 Essential (primary) hypertension: Secondary | ICD-10-CM | POA: Diagnosis not present

## 2016-08-15 DIAGNOSIS — G44221 Chronic tension-type headache, intractable: Secondary | ICD-10-CM

## 2016-08-15 DIAGNOSIS — F331 Major depressive disorder, recurrent, moderate: Secondary | ICD-10-CM

## 2016-08-15 MED ORDER — ACETAMINOPHEN-CODEINE #3 300-30 MG PO TABS: 1.0000 | ORAL_TABLET | ORAL | 0 refills | Status: DC | PRN

## 2016-08-15 MED ORDER — ESCITALOPRAM OXALATE 10 MG PO TABS: 10.0000 mg | ORAL_TABLET | Freq: Every day | ORAL | 11 refills | Status: DC

## 2016-08-15 NOTE — Progress Notes (Signed)
Pt c/o possible depression.She c/o episode of crying, up and down sad days, suicidal thoughts, denies thoughts of harming her children. SVD twins 12/05/15. Pt requesting antidepressant medication and/or other options to assist her.

## 2016-08-15 NOTE — Progress Notes (Signed)
Patient ID: Breanna Yates, female   DOB: 11-21-1985, 31 y.o.   MRN: [id number]  Chief Complaint  Patient presents with  . Depression    HPI Breanna Yates is a 31 y.o. female.  Complains of emotional instability, sad and uncontrollable crying.  Has 3 young children at home.  Just feels that she can't shake this  " [place]."  Has not seriously thought about harming herself or others. HPI  Past Medical History:  Diagnosis Date  . Chlamydia 2008  . CIN III (cervical intraepithelial neoplasia grade III) with severe dysplasia   . Depression    mild per pt- overwhelmed and tired  . GDM (gestational diabetes mellitus) 11/26/2015  . History of gestational diabetes mellitus (GDM)   . Hypertension     Past Surgical History:  Procedure Laterality Date  . CERVICAL CONIZATION W/BX N/A 09/18/2013   Procedure: CONIZATION CERVIX;  Surgeon: Frederico Hamman, MD;  Location: [facility];  Service: Gynecology;  Laterality: N/A;  . LAPAROSCOPIC TUBAL LIGATION N/A 03/01/2016   Procedure: LAPAROSCOPIC TUBAL LIGATION WITH FILSHIE CLIPS;  Surgeon: Osborne Oman, MD;  Location: [facility];  Service: Gynecology;  Laterality: N/A;  . TONGUE SURGERY    . WISDOM TOOTH EXTRACTION      Family History  Problem Relation Age of Onset  . Hypertension Mother   . Asthma Sister   . Cancer Sister     brain tumor and seizures  . Hypertension Maternal Grandmother   . Diabetes Maternal Grandmother     Social History Social History  Substance Use Topics  . Smoking status: Current Every Day Smoker    Packs/day: 0.25    Years: 13.00    Types: Cigarettes  . Smokeless tobacco: Never Used  . Alcohol use No    Allergies  Allergen Reactions  . Pineapple Itching    Oral itching, no swelling    Current Outpatient Prescriptions  Medication Sig Dispense Refill  . acetaminophen-codeine (TYLENOL #3) 300-30 MG tablet Take 1 tablet by mouth every 4 (four) hours as needed.  0  . acetaminophen-codeine (TYLENOL #3) 300-30  MG tablet Take 1 tablet by mouth every 4 (four) hours as needed for moderate pain. 30 tablet 0  . BLISOVI 24 FE 1-20 MG-MCG(24) tablet Take 1 tablet by mouth daily.  12  . escitalopram (LEXAPRO) 10 MG tablet Take 1 tablet (10 mg total) by mouth daily. 30 tablet 11  . hydrochlorothiazide (HYDRODIURIL) 12.5 MG tablet Take 2 tablets (25 mg total) by mouth daily. (Patient not taking: Reported on 08/15/2016) 30 tablet 2  . ibuprofen (ADVIL,MOTRIN) 800 MG tablet Take 1 tablet (800 mg total) by mouth 3 (three) times daily. (Patient not taking: Reported on 08/15/2016) 30 tablet 1  . oxyCODONE-acetaminophen (PERCOCET/ROXICET) 5-325 MG tablet Take 1-2 tablets by mouth every 6 (six) hours as needed. (Patient not taking: Reported on 08/15/2016) 10 tablet 0   No current facility-administered medications for this visit.     Review of Systems Review of Systems Constitutional: negative for fatigue and weight loss Respiratory: negative for cough and wheezing Cardiovascular: negative for chest pain, fatigue and palpitations Gastrointestinal: negative for abdominal pain and change in bowel habits Genitourinary:negative Integument/breast: negative for nipple discharge Musculoskeletal:negative for myalgias Neurological: negative for gait problems and tremors Behavioral/Psych: positive for depression Endocrine: negative for temperature intolerance      not currently breastfeeding.  Physical Exam Physical Exam:  Deferred 50% of 10 min visit spent on counseling and coordination of care.  Data Reviewed Chart review  Assessment     Depression Hypertension Chronic HA's    Plan    Lexapro Rx for Deptression Referred to [facility] for counseling. HCTZ Rx for HTN Tylenol #3 Rx for HA's Follow 8 months for pap and annual or prn  Orders Placed This Encounter  Procedures  . Ambulatory referral to [facility]    Referral Priority:   Routine    Referral Type:    Consultation    Referral Reason:   Specialty Services Required    Number of Visits Requested:   1   Meds ordered this encounter  Medications  . escitalopram (LEXAPRO) 10 MG tablet    Sig: Take 1 tablet (10 mg total) by mouth daily.    Dispense:  30 tablet    Refill:  11  . acetaminophen-codeine (TYLENOL #3) 300-30 MG tablet    Sig: Take 1 tablet by mouth every 4 (four) hours as needed for moderate pain.    Dispense:  30 tablet    Refill:  0

## 2016-08-22 ENCOUNTER — Ambulatory Visit (INDEPENDENT_AMBULATORY_CARE_PROVIDER_SITE_OTHER): Payer: Self-pay | Admitting: Clinical

## 2016-08-22 DIAGNOSIS — F411 Generalized anxiety disorder: Secondary | ICD-10-CM

## 2016-08-22 NOTE — BH Specialist Note (Signed)
Session Start time: 4:30   End Time: 5:20 Total Time:  50 minutes Type of Service: [place] Interpreter: No.   Interpreter Name & Language: n/a # [facility] Visits July 2017-June 2018: 1st  SUBJECTIVE: Breanna Yates is a 31 y.o. female  Pt. was referred by Dr Jodi Mourning for:  anxiety and depression. Pt. reports the following symptoms/concerns: Pt states that she feels overwhelmed with 36 58-month olds, 29yo, 31yo Duration of problem:  At least 8 months Severity: severe Previous treatment: none  OBJECTIVE: Mood: Appropriate & Affect: Appropriate Risk of harm to self or others: No known risk of harm to self or others Assessments administered: PHQ9: 14/ GAD7: 16  LIFE CONTEXT:  Family & Social: Lives with twin 35mos, 65yo, 42yo (Who,family proximity, relationship, friends) Higher education careers adviser Work: Working (Where, how often, or financial support) Self-Care: n/a at this time (Exercise, sleep, eat, substances) Life changes: Moving soon What is important to pt/family (values): Overall wellbeing, family  GOALS ADDRESSED:  -Reduce symptoms of anxiety and depression  INTERVENTIONS: Solution Focused   ASSESSMENT:  Pt currently experiencing Generalized anxiety disorder.  Pt may benefit from psychoeducation, brief therapeutic interventions regarding coping with symptoms of anxiety and depression, along with referral to psychiatry for [facility] med management.   PLAN: 1. F/U with behavioral health clinician: Two weeks, or as needed 2. Behavioral Health meds: Not taking 3. Behavioral recommendations:  -Establish care with [facility] of the [place], for [employer] med management -Read educational material regarding coping with symptoms of anxiety and depression -Take 5 minute calm breaks, as needed, throughout the day -Use problem-solving strategy to prioritize life stressors, as discussed in office visit 4. Referral: Brief Counseling/Psychotherapy, Problem-solving teaching/coping strategies,  Psychoeducation and Referral to [place] provider 5. From scale of 1-10, how likely are you to follow plan: 8  Woc-Behavioral Health Clinician  Behavioral Health Clinician  Warmhandoff: no   Depression screen [facility] 2/9 08/22/2016 08/22/2016 11/04/2015 10/21/2015 10/07/2015  Decreased Interest 2 0 0 0 0  Down, Depressed, Hopeless 2 3 0 0 0  PHQ - 2 Score 4 3 0 0 0  Altered sleeping 3 3 - - -  Tired, decreased energy 3 3 - - -  Change in appetite 3 3 - - -  Feeling bad or failure about yourself  0 3 - - -  Trouble concentrating 0 3 - - -  Moving slowly or fidgety/restless 0 3 - - -  Suicidal thoughts 0 0 - - -  PHQ-9 Score 13 21 - - -   GAD 7 : Generalized Anxiety Score 08/22/2016  Nervous, Anxious, on Edge 0  Control/stop worrying 3  Worry too much - different things 3  Trouble relaxing 3  Restless 3  Easily annoyed or irritable 3  Afraid - awful might happen 0  Total GAD 7 Score 15

## 2016-12-29 ENCOUNTER — Encounter (HOSPITAL_COMMUNITY): Payer: Self-pay | Admitting: *Deleted

## 2016-12-29 ENCOUNTER — Inpatient Hospital Stay (HOSPITAL_COMMUNITY)
Admission: AD | Admit: 2016-12-29 | Discharge: 2016-12-30 | Disposition: A | Discharge status: Home / Self Care | Payer: Medicaid Other | Source: Ambulatory Visit | Attending: Obstetrics & Gynecology | Admitting: Obstetrics & Gynecology

## 2016-12-29 DIAGNOSIS — F17210 Nicotine dependence, cigarettes, uncomplicated: Secondary | ICD-10-CM | POA: Diagnosis not present

## 2016-12-29 DIAGNOSIS — J069 Acute upper respiratory infection, unspecified: Secondary | ICD-10-CM | POA: Insufficient documentation

## 2016-12-29 DIAGNOSIS — R1084 Generalized abdominal pain: Secondary | ICD-10-CM | POA: Insufficient documentation

## 2016-12-29 NOTE — MAU Note (Addendum)
Pt says her LMP was 5-26 -   Was early  And  Lasted 10 days.  No HPT - BTL- 2017.        She  Thinks she  Has flu- has cold chills, H/A -   Took  Ibuprofen at 1100-    Some relief.     Has cramps- started  Beginning of June.

## 2016-12-30 DIAGNOSIS — J069 Acute upper respiratory infection, unspecified: Secondary | ICD-10-CM

## 2016-12-30 LAB — URINALYSIS, ROUTINE W REFLEX MICROSCOPIC
Bilirubin Urine: NEGATIVE
GLUCOSE, UA: NEGATIVE mg/dL
Hgb urine dipstick: NEGATIVE
Ketones, ur: NEGATIVE mg/dL
LEUKOCYTES UA: NEGATIVE
Nitrite: NEGATIVE
PH: 7 (ref 5.0–8.0)
Protein, ur: NEGATIVE mg/dL
SPECIFIC GRAVITY, URINE: 1.005 (ref 1.005–1.030)

## 2016-12-30 LAB — POCT PREGNANCY, URINE: Preg Test, Ur: NEGATIVE

## 2016-12-30 NOTE — MAU Provider Note (Signed)
History     CSN: [id number]  Arrival date and time: 12/29/16 2332   First Provider Initiated Contact with Patient 12/30/16 0049      Chief Complaint  Patient presents with  . URI  . Abdominal Pain   URI   This is a new ("my kids have been since too") problem. The current episode started in the past 7 days. The problem has been unchanged. The maximum temperature recorded prior to her arrival was 101 - 101.9 F. The fever has been present for less than 1 day. Associated symptoms include nausea. Pertinent negatives include no dysuria or vomiting. She has tried acetaminophen, NSAIDs, decongestant and sleep for the symptoms. The treatment provided mild relief.  Pelvic Pain  The patient's primary symptoms include pelvic pain. The patient's pertinent negatives include no vaginal discharge. This is a new problem. Episode onset: about 2 weeks ago. The problem occurs constantly. The problem has been unchanged. Pain severity now: 9/10  The problem affects the right side. She is not pregnant. Associated symptoms include a fever (not today, but earlier in the week) and nausea. Pertinent negatives include no chills, dysuria, frequency, urgency or vomiting. The vaginal discharge was normal. There has been no bleeding ("my period came early, and stayed late"). Nothing aggravates the symptoms. She has tried acetaminophen and NSAIDs for the symptoms. The treatment provided mild relief. She uses tubal ligation for contraception. Her menstrual history has been regular (12/10/16 ).   Past Medical History:  Diagnosis Date  . Chlamydia 2008  . CIN III (cervical intraepithelial neoplasia grade III) with severe dysplasia   . Depression    mild per pt- overwhelmed and tired  . GDM (gestational diabetes mellitus) 11/26/2015  . History of gestational diabetes mellitus (GDM)   . Hypertension     Past Surgical History:  Procedure Laterality Date  . CERVICAL CONIZATION W/BX N/A 09/18/2013   Procedure: CONIZATION  CERVIX;  Surgeon: Frederico Hamman, MD;  Location: [facility];  Service: Gynecology;  Laterality: N/A;  . LAPAROSCOPIC TUBAL LIGATION N/A 03/01/2016   Procedure: LAPAROSCOPIC TUBAL LIGATION WITH FILSHIE CLIPS;  Surgeon: Osborne Oman, MD;  Location: [facility];  Service: Gynecology;  Laterality: N/A;  . TONGUE SURGERY    . WISDOM TOOTH EXTRACTION      Family History  Problem Relation Age of Onset  . Hypertension Mother   . Asthma Sister   . Cancer Sister        brain tumor and seizures  . Hypertension Maternal Grandmother   . Diabetes Maternal Grandmother     Social History  Substance Use Topics  . Smoking status: Current Every Day Smoker    Packs/day: 0.25    Years: 13.00    Types: Cigarettes  . Smokeless tobacco: Never Used  . Alcohol use No    Allergies:  Allergies  Allergen Reactions  . Pineapple Itching    Oral itching, no swelling    Prescriptions Prior to Admission  Medication Sig Dispense Refill Last Dose  . acetaminophen-codeine (TYLENOL #3) 300-30 MG tablet Take 1 tablet by mouth every 4 (four) hours as needed.  0 Not Taking  . acetaminophen-codeine (TYLENOL #3) 300-30 MG tablet Take 1 tablet by mouth every 4 (four) hours as needed for moderate pain. 30 tablet 0   . BLISOVI 24 FE 1-20 MG-MCG(24) tablet Take 1 tablet by mouth daily.  12 Not Taking  . escitalopram (LEXAPRO) 10 MG tablet Take 1 tablet (10 mg total) by mouth daily. 30 tablet  11   . hydrochlorothiazide (HYDRODIURIL) 12.5 MG tablet Take 2 tablets (25 mg total) by mouth daily. (Patient not taking: Reported on 08/15/2016) 30 tablet 2 Not Taking  . ibuprofen (ADVIL,MOTRIN) 800 MG tablet Take 1 tablet (800 mg total) by mouth 3 (three) times daily. (Patient not taking: Reported on 08/15/2016) 30 tablet 1 Not Taking  . oxyCODONE-acetaminophen (PERCOCET/ROXICET) 5-325 MG tablet Take 1-2 tablets by mouth every 6 (six) hours as needed. (Patient not taking: Reported on 08/15/2016) 10 tablet 0 Not Taking    Review of  Systems  Constitutional: Positive for fever (not today, but earlier in the week). Negative for chills.  Gastrointestinal: Positive for nausea. Negative for vomiting.  Genitourinary: Positive for pelvic pain. Negative for dysuria, frequency, urgency, vaginal bleeding and vaginal discharge.   Physical Exam   Blood pressure (!) 155/92, pulse 99, temperature 99 F (37.2 C), temperature source Oral, height 5\' 7"  (1.702 m), weight 212 lb (96.2 kg), last menstrual period 12/10/2016, not currently breastfeeding.  Physical Exam  Nursing note and vitals reviewed. Constitutional: She is oriented to person, place, and time. She appears well-developed and well-nourished. No distress.  HENT:  Head: Normocephalic.  Cardiovascular: Normal rate.   Respiratory: Effort normal and breath sounds normal. No respiratory distress.  GI: Soft. There is no rebound.  Neurological: She is alert and oriented to person, place, and time.  Skin: Skin is warm and dry.  Psychiatric: She has a normal mood and affect.    Results for orders placed or performed during the hospital encounter of 12/29/16 (from the past 24 hour(s))  Urinalysis, Routine w reflex microscopic     Status: Abnormal   Collection Time: 12/29/16 11:50 PM  Result Value Ref Range   Color, Urine STRAW (A) YELLOW   APPearance CLEAR CLEAR   Specific Gravity, Urine 1.005 1.005 - 1.030   pH 7.0 5.0 - 8.0   Glucose, UA NEGATIVE NEGATIVE mg/dL   Hgb urine dipstick NEGATIVE NEGATIVE   Bilirubin Urine NEGATIVE NEGATIVE   Ketones, ur NEGATIVE NEGATIVE mg/dL   Protein, ur NEGATIVE NEGATIVE mg/dL   Nitrite NEGATIVE NEGATIVE   Leukocytes, UA NEGATIVE NEGATIVE  Pregnancy, urine POC     Status: None   Collection Time: 12/30/16 12:05 AM  Result Value Ref Range   Preg Test, Ur NEGATIVE NEGATIVE    MAU Course  Procedures  MDM   Assessment and Plan   1. Viral URI   2. Generalized abdominal pain    DC home Comfort measures reviewed  Order placed  for outpatient GYN ultrasound.  RX: none Return to MAU as needed   Follow-up Information    Shelly Bombard, MD Follow up.   Specialty:  Obstetrics and Gynecology Why:  Call his office for follow up appointment  Contact information: [address] [id number] [address] 12/30/2016, 12:51 AM

## 2016-12-30 NOTE — Discharge Instructions (Signed)

## 2017-01-07 ENCOUNTER — Other Ambulatory Visit: Payer: Self-pay | Admitting: Obstetrics

## 2017-01-11 ENCOUNTER — Other Ambulatory Visit (HOSPITAL_COMMUNITY)
Admission: RE | Admit: 2017-01-11 | Discharge: 2017-01-11 | Disposition: A | Discharge status: Home / Self Care | Payer: Medicaid Other | Source: Ambulatory Visit | Attending: Obstetrics | Admitting: Obstetrics

## 2017-01-11 ENCOUNTER — Ambulatory Visit (INDEPENDENT_AMBULATORY_CARE_PROVIDER_SITE_OTHER): Payer: Medicaid Other | Admitting: Obstetrics

## 2017-01-11 DIAGNOSIS — N898 Other specified noninflammatory disorders of vagina: Secondary | ICD-10-CM | POA: Insufficient documentation

## 2017-01-11 DIAGNOSIS — R102 Pelvic and perineal pain: Secondary | ICD-10-CM

## 2017-01-11 DIAGNOSIS — G44221 Chronic tension-type headache, intractable: Secondary | ICD-10-CM

## 2017-01-11 MED ORDER — ACETAMINOPHEN-CODEINE #3 300-30 MG PO TABS: 1.0000 | ORAL_TABLET | ORAL | 0 refills | Status: DC | PRN

## 2017-01-11 MED ORDER — METRONIDAZOLE 500 MG PO TABS: 500.0000 mg | ORAL_TABLET | Freq: Two times a day (BID) | ORAL | 2 refills | Status: DC

## 2017-01-11 MED ORDER — DOXYCYCLINE HYCLATE 100 MG PO CAPS: 100.0000 mg | ORAL_CAPSULE | Freq: Two times a day (BID) | ORAL | 0 refills | Status: DC

## 2017-01-12 ENCOUNTER — Encounter: Payer: Self-pay | Admitting: Obstetrics

## 2017-01-12 NOTE — Progress Notes (Signed)
Patient ID: AIBHLINN KALMAR, female   DOB: December 23, 1985, 31 y.o.   MRN: [id number]  Chief Complaint  Patient presents with  . Gynecologic Exam    pt would like std testing today. pt states she has had irregular cycels the past few months. pt is concerned about possilble pregnancy although she has had BTL.    HPI VIVA GALLAHER is a 31 y.o. female.  Irregular cycles.  Also has had HA's over the past few months. HPI  Past Medical History:  Diagnosis Date  . Chlamydia 2008  . CIN III (cervical intraepithelial neoplasia grade III) with severe dysplasia   . Depression    mild per pt- overwhelmed and tired  . GDM (gestational diabetes mellitus) 11/26/2015  . History of gestational diabetes mellitus (GDM)   . Hypertension     Past Surgical History:  Procedure Laterality Date  . CERVICAL CONIZATION W/BX N/A 09/18/2013   Procedure: CONIZATION CERVIX;  Surgeon: Frederico Hamman, MD;  Location: [facility];  Service: Gynecology;  Laterality: N/A;  . LAPAROSCOPIC TUBAL LIGATION N/A 03/01/2016   Procedure: LAPAROSCOPIC TUBAL LIGATION WITH FILSHIE CLIPS;  Surgeon: Osborne Oman, MD;  Location: [facility];  Service: Gynecology;  Laterality: N/A;  . TONGUE SURGERY    . WISDOM TOOTH EXTRACTION      Family History  Problem Relation Age of Onset  . Hypertension Mother   . Asthma Sister   . Cancer Sister        brain tumor and seizures  . Hypertension Maternal Grandmother   . Diabetes Maternal Grandmother     Social History Social History  Substance Use Topics  . Smoking status: Current Every Day Smoker    Packs/day: 0.25    Years: 13.00    Types: Cigarettes  . Smokeless tobacco: Never Used  . Alcohol use No    Allergies  Allergen Reactions  . Pineapple Itching    Oral itching, no swelling    Current Outpatient Prescriptions  Medication Sig Dispense Refill  . acetaminophen-codeine (TYLENOL #3) 300-30 MG tablet Take 1 tablet by mouth every 4 (four) hours as needed for moderate pain.  30 tablet 0  . escitalopram (LEXAPRO) 10 MG tablet Take 1 tablet (10 mg total) by mouth daily. 30 tablet 11  . hydrochlorothiazide (HYDRODIURIL) 12.5 MG tablet Take 2 tablets (25 mg total) by mouth daily. 30 tablet 2  . ibuprofen (ADVIL,MOTRIN) 800 MG tablet Take 1 tablet (800 mg total) by mouth 3 (three) times daily. 30 tablet 1  . BLISOVI 24 FE 1-20 MG-MCG(24) tablet Take 1 tablet by mouth daily.  12  . doxycycline (VIBRAMYCIN) 100 MG capsule Take 1 capsule (100 mg total) by mouth 2 (two) times daily. 14 capsule 0  . metroNIDAZOLE (FLAGYL) 500 MG tablet Take 1 tablet (500 mg total) by mouth 2 (two) times daily. 14 tablet 2   No current facility-administered medications for this visit.     Review of Systems Review of Systems Constitutional: negative for fatigue and weight loss Respiratory: negative for cough and wheezing Cardiovascular: negative for chest pain, fatigue and palpitations Gastrointestinal: negative for abdominal pain and change in bowel habits Genitourinary:positive for irregular cycles and pelvic pain Integument/breast: negative for nipple discharge Musculoskeletal:negative for myalgias Neurological: positive for HA's Behavioral/Psych: negative for abusive relationship, depression Endocrine: negative for temperature intolerance        Physical Exam Physical Exam           General:  Alert and no distress Abdomen:  normal findings: no organomegaly, soft, non-tender and no hernia  Pelvis:  External genitalia: normal general appearance Urinary system: urethral meatus normal and bladder without fullness, nontender Vaginal: normal without tenderness, induration or masses Cervix: normal appearance Adnexa: normal bimanual exam Uterus: anteverted and tender, normal size    50% of 15 min visit spent on counseling and coordination of care.    Data Reviewed Labs  Assessment     1. Pelvic pain Rx: - US Pelvis Complete; Future - US Transvaginal Non-OB; Future  2.  Vaginal discharge Rx: - Cervicovaginal ancillary only - metroNIDAZOLE (FLAGYL) 500 MG tablet; Take 1 tablet (500 mg total) by mouth 2 (two) times daily.  Dispense: 14 tablet; Refill: 2 - doxycycline (VIBRAMYCIN) 100 MG capsule; Take 1 capsule (100 mg total) by mouth 2 (two) times daily.  Dispense: 14 capsule; Refill: 0  3. Chronic tension-type headache, intractable Rx: - acetaminophen-codeine (TYLENOL #3) 300-30 MG tablet; Take 1 tablet by mouth every 4 (four) hours as needed for moderate pain.  Dispense: 30 tablet; Refill: 0    Plan    Follow up 6 weeks  Orders Placed This Encounter  Procedures  . US Pelvis Complete    Standing Status:   Future    Standing Expiration Date:   03/13/2018    Order Specific Question:   Reason for Exam (SYMPTOM  OR DIAGNOSIS REQUIRED)    Answer:   Pelvic pain    Order Specific Question:   Preferred imaging location?    Answer:   [facility]  . US Transvaginal Non-OB    Standing Status:   Future    Standing Expiration Date:   03/13/2018    Order Specific Question:   Reason for Exam (SYMPTOM  OR DIAGNOSIS REQUIRED)    Answer:   Pelvic pain    Order Specific Question:   Preferred imaging location?    Answer:   [facility]   Meds ordered this encounter  Medications  . acetaminophen-codeine (TYLENOL #3) 300-30 MG tablet    Sig: Take 1 tablet by mouth every 4 (four) hours as needed for moderate pain.    Dispense:  30 tablet    Refill:  0  . metroNIDAZOLE (FLAGYL) 500 MG tablet    Sig: Take 1 tablet (500 mg total) by mouth 2 (two) times daily.    Dispense:  14 tablet    Refill:  2  . doxycycline (VIBRAMYCIN) 100 MG capsule    Sig: Take 1 capsule (100 mg total) by mouth 2 (two) times daily.    Dispense:  14 capsule    Refill:  0     Patient ID: Fidela Juneau, female   DOB: 08/12/1985, 31 y.o.   MRN: [id number]

## 2017-01-13 LAB — CERVICOVAGINAL ANCILLARY ONLY
Bacterial vaginitis: POSITIVE — AB
CHLAMYDIA, DNA PROBE: POSITIVE — AB
Candida vaginitis: NEGATIVE
Neisseria Gonorrhea: NEGATIVE
Trichomonas: NEGATIVE

## 2017-01-16 ENCOUNTER — Encounter: Payer: Self-pay | Admitting: *Deleted

## 2017-01-16 ENCOUNTER — Other Ambulatory Visit: Payer: Self-pay | Admitting: Obstetrics

## 2017-01-16 ENCOUNTER — Telehealth: Payer: Self-pay | Admitting: *Deleted

## 2017-01-16 DIAGNOSIS — A749 Chlamydial infection, unspecified: Secondary | ICD-10-CM

## 2017-01-16 MED ORDER — AZITHROMYCIN 250 MG PO TABS
1000.0000 mg | ORAL_TABLET | Freq: Once | ORAL | 0 refills | Status: AC
Start: 2017-01-16 — End: 2017-01-16

## 2017-01-16 MED ORDER — CEFIXIME 400 MG PO CAPS: 400.0000 mg | ORAL_CAPSULE | Freq: Once | ORAL | 0 refills | Status: AC

## 2017-01-16 NOTE — Telephone Encounter (Signed)
Spoke with pt regarding lab results and Rx info.  Pt state she would also like to know if provider can refill BP medication, HCTZ.   Please send refill if approved.

## 2017-01-17 ENCOUNTER — Other Ambulatory Visit: Payer: Self-pay | Admitting: Obstetrics

## 2017-01-17 DIAGNOSIS — I10 Essential (primary) hypertension: Secondary | ICD-10-CM

## 2017-01-17 MED ORDER — HYDROCHLOROTHIAZIDE 25 MG PO TABS: 25.0000 mg | ORAL_TABLET | Freq: Every day | ORAL | 11 refills | Status: DC

## 2017-01-17 NOTE — Telephone Encounter (Signed)
HCTZ Rx 

## 2017-01-19 ENCOUNTER — Ambulatory Visit (HOSPITAL_COMMUNITY)
Admission: RE | Admit: 2017-01-19 | Discharge: 2017-01-19 | Disposition: A | Discharge status: Home / Self Care | Payer: Medicaid Other | Source: Ambulatory Visit | Attending: Obstetrics | Admitting: Obstetrics

## 2017-01-19 DIAGNOSIS — R102 Pelvic and perineal pain: Secondary | ICD-10-CM | POA: Insufficient documentation

## 2017-01-19 DIAGNOSIS — D259 Leiomyoma of uterus, unspecified: Secondary | ICD-10-CM | POA: Diagnosis not present

## 2017-02-23 ENCOUNTER — Ambulatory Visit (INDEPENDENT_AMBULATORY_CARE_PROVIDER_SITE_OTHER): Payer: Medicaid Other | Admitting: Obstetrics

## 2017-02-23 ENCOUNTER — Other Ambulatory Visit (HOSPITAL_COMMUNITY)
Admission: RE | Admit: 2017-02-23 | Discharge: 2017-02-23 | Disposition: A | Discharge status: Home / Self Care | Payer: Medicaid Other | Source: Ambulatory Visit | Attending: Obstetrics | Admitting: Obstetrics

## 2017-02-23 ENCOUNTER — Encounter: Payer: Self-pay | Admitting: Obstetrics

## 2017-02-23 VITALS — BP 120/80 | HR 72 | Ht 67.0 in | Wt 207.7 lb

## 2017-02-23 DIAGNOSIS — Z113 Encounter for screening for infections with a predominantly sexual mode of transmission: Secondary | ICD-10-CM

## 2017-02-23 DIAGNOSIS — Z124 Encounter for screening for malignant neoplasm of cervix: Secondary | ICD-10-CM

## 2017-02-23 DIAGNOSIS — N898 Other specified noninflammatory disorders of vagina: Secondary | ICD-10-CM

## 2017-02-23 DIAGNOSIS — Z01419 Encounter for gynecological examination (general) (routine) without abnormal findings: Secondary | ICD-10-CM | POA: Insufficient documentation

## 2017-02-23 NOTE — Progress Notes (Signed)
Patient is in the office for annual, last pap 01-26-16.

## 2017-02-23 NOTE — Progress Notes (Signed)
Subjective:        Breanna Yates is a 31 y.o. female here for a routine exam.  Current complaints: None.    Personal health questionnaire:  Is patient Ashkenazi Jewish, have a family history of breast and/or ovarian cancer: no Is there a family history of uterine cancer diagnosed at age < 68, gastrointestinal cancer, urinary tract cancer, family member who is a Field seismologist syndrome-associated carrier: no Is the patient overweight and hypertensive, family history of diabetes, personal history of gestational diabetes, preeclampsia or PCOS: no Is patient over 29, have PCOS,  family history of premature CHD under age 21, diabetes, smoke, have hypertension or peripheral artery disease:  no At any time, has a partner hit, kicked or otherwise hurt or frightened you?: no Over the past 2 weeks, have you felt down, depressed or hopeless?: no Over the past 2 weeks, have you felt little interest or pleasure in doing things?:no   Gynecologic History Patient's last menstrual period was 02/03/2017. Contraception: tubal ligation Last Pap: 2017. Results were: normal Last mammogram: n/a. Results were: n/a  Obstetric History OB History  Gravida Para Term Preterm AB Living  3 3 1 2  0 4  SAB TAB Ectopic Multiple Live Births  0 0 0 1 4    # Outcome Date GA Lbr Len/2nd Weight Sex Delivery Anes PTL Lv  3A Preterm 12/05/15 [redacted]w[redacted]d  3 lb 7.4 oz (1.57 kg) M Vag-Spont None  LIV  3B Preterm 12/05/15 [redacted]w[redacted]d  3 lb 2.1 oz (1.42 kg) M Vag-Spont None  LIV  2 Preterm 02/11/15 [redacted]w[redacted]d    Vag-Spont     1 Term 01/12/12 [redacted]w[redacted]d 06:59 / 00:41 6 lb 15.6 oz (3.165 kg) F Vag-Spont EPI  LIV     Birth Comments: none      Past Medical History:  Diagnosis Date  . Chlamydia 2008  . CIN III (cervical intraepithelial neoplasia grade III) with severe dysplasia   . Depression    mild per pt- overwhelmed and tired  . GDM (gestational diabetes mellitus) 11/26/2015  . History of gestational diabetes mellitus (GDM)   . Hypertension      Past Surgical History:  Procedure Laterality Date  . CERVICAL CONIZATION W/BX N/A 09/18/2013   Procedure: CONIZATION CERVIX;  Surgeon: Frederico Hamman, MD;  Location: [facility];  Service: Gynecology;  Laterality: N/A;  . LAPAROSCOPIC TUBAL LIGATION N/A 03/01/2016   Procedure: LAPAROSCOPIC TUBAL LIGATION WITH FILSHIE CLIPS;  Surgeon: Osborne Oman, MD;  Location: [facility];  Service: Gynecology;  Laterality: N/A;  . TONGUE SURGERY    . WISDOM TOOTH EXTRACTION       Current Outpatient Prescriptions:  .  acetaminophen-codeine (TYLENOL #3) 300-30 MG tablet, Take 1 tablet by mouth every 4 (four) hours as needed for moderate pain., Disp: 30 tablet, Rfl: 0 .  escitalopram (LEXAPRO) 10 MG tablet, Take 1 tablet (10 mg total) by mouth daily., Disp: 30 tablet, Rfl: 11 .  hydrochlorothiazide (HYDRODIURIL) 25 MG tablet, Take 1 tablet (25 mg total) by mouth daily., Disp: 30 tablet, Rfl: 11 .  BLISOVI 24 FE 1-20 MG-MCG(24) tablet, Take 1 tablet by mouth daily., Disp: , Rfl: 12 .  doxycycline (VIBRAMYCIN) 100 MG capsule, Take 1 capsule (100 mg total) by mouth 2 (two) times daily. (Patient not taking: Reported on 02/23/2017), Disp: 14 capsule, Rfl: 0 .  hydrochlorothiazide (HYDRODIURIL) 12.5 MG tablet, Take 2 tablets (25 mg total) by mouth daily. (Patient not taking: Reported on 02/23/2017), Disp: 30 tablet, Rfl:  2 .  ibuprofen (ADVIL,MOTRIN) 800 MG tablet, Take 1 tablet (800 mg total) by mouth 3 (three) times daily. (Patient not taking: Reported on 02/23/2017), Disp: 30 tablet, Rfl: 1 .  metroNIDAZOLE (FLAGYL) 500 MG tablet, Take 1 tablet (500 mg total) by mouth 2 (two) times daily. (Patient not taking: Reported on 02/23/2017), Disp: 14 tablet, Rfl: 2 Allergies  Allergen Reactions  . Pineapple Itching    Oral itching, no swelling    Social History  Substance Use Topics  . Smoking status: Current Every Day Smoker    Packs/day: 0.25    Years: 13.00    Types: Cigarettes  . Smokeless tobacco: Never Used  .  Alcohol use No    Family History  Problem Relation Age of Onset  . Hypertension Mother   . Asthma Sister   . Cancer Sister        brain tumor and seizures  . Hypertension Maternal Grandmother   . Diabetes Maternal Grandmother       Review of Systems  Constitutional: negative for fatigue and weight loss Respiratory: negative for cough and wheezing Cardiovascular: negative for chest pain, fatigue and palpitations Gastrointestinal: negative for abdominal pain and change in bowel habits Musculoskeletal:negative for myalgias Neurological: negative for gait problems and tremors Behavioral/Psych: negative for abusive relationship, depression Endocrine: negative for temperature intolerance    Genitourinary:negative for abnormal menstrual periods, genital lesions, hot flashes, sexual problems and vaginal discharge Integument/breast: negative for breast lump, breast tenderness, nipple discharge and skin lesion(s)    Objective:       BP 120/80   Pulse 72   Ht 5\' 7"  (1.702 m)   Wt 207 lb 11.2 oz (94.2 kg)   LMP 02/03/2017   Breastfeeding? No   BMI 32.53 kg/m  General:   alert  Skin:   no rash or abnormalities  Lungs:   clear to auscultation bilaterally  Heart:   regular rate and rhythm, S1, S2 normal, no murmur, click, rub or gallop  Breasts:   normal without suspicious masses, skin or nipple changes or axillary nodes  Abdomen:  normal findings: no organomegaly, soft, non-tender and no hernia  Pelvis:  External genitalia: normal general appearance Urinary system: urethral meatus normal and bladder without fullness, nontender Vaginal: normal without tenderness, induration or masses Cervix: normal appearance Adnexa: normal bimanual exam Uterus: anteverted and non-tender, normal size   Lab Review Urine pregnancy test Labs reviewed yes Radiologic studies reviewed no  50% of 20 min visit spent on counseling and coordination of care.    Assessment:     1. Encounter for  routine gynecological examination with Papanicolaou smear of cervix Rx: - Cytology - PAP  2. Vaginal discharge Rx: - Cervicovaginal ancillary only   Plan:    Education reviewed: calcium supplements, depression evaluation, low fat, low cholesterol diet, safe sex/STD prevention, self breast exams, smoking cessation and weight bearing exercise. Follow up in: 1 year.   No orders of the defined types were placed in this encounter.  No orders of the defined types were placed in this encounter.

## 2017-02-27 ENCOUNTER — Other Ambulatory Visit: Payer: Self-pay | Admitting: Obstetrics

## 2017-02-27 DIAGNOSIS — B373 Candidiasis of vulva and vagina: Secondary | ICD-10-CM

## 2017-02-27 DIAGNOSIS — B3731 Acute candidiasis of vulva and vagina: Secondary | ICD-10-CM

## 2017-02-27 LAB — CERVICOVAGINAL ANCILLARY ONLY
BACTERIAL VAGINITIS: NEGATIVE
CANDIDA VAGINITIS: POSITIVE — AB
Chlamydia: NEGATIVE
Neisseria Gonorrhea: NEGATIVE
TRICH (WINDOWPATH): NEGATIVE

## 2017-02-27 MED ORDER — FLUCONAZOLE 150 MG PO TABS: 150.0000 mg | ORAL_TABLET | Freq: Once | ORAL | 2 refills | Status: AC

## 2017-02-28 LAB — CYTOLOGY - PAP: Diagnosis: NEGATIVE

## 2017-03-01 ENCOUNTER — Other Ambulatory Visit: Payer: Self-pay | Admitting: Obstetrics

## 2017-03-01 ENCOUNTER — Encounter: Payer: Self-pay | Admitting: *Deleted

## 2017-03-01 DIAGNOSIS — B3731 Acute candidiasis of vulva and vagina: Secondary | ICD-10-CM

## 2017-03-01 DIAGNOSIS — B373 Candidiasis of vulva and vagina: Secondary | ICD-10-CM

## 2017-03-01 DIAGNOSIS — N760 Acute vaginitis: Secondary | ICD-10-CM

## 2017-03-01 DIAGNOSIS — B9689 Other specified bacterial agents as the cause of diseases classified elsewhere: Secondary | ICD-10-CM

## 2017-03-01 DIAGNOSIS — N898 Other specified noninflammatory disorders of vagina: Secondary | ICD-10-CM

## 2017-03-01 MED ORDER — METRONIDAZOLE 500 MG PO TABS: 500.0000 mg | ORAL_TABLET | Freq: Two times a day (BID) | ORAL | 2 refills | Status: DC

## 2017-03-01 MED ORDER — FLUCONAZOLE 150 MG PO TABS: 150.0000 mg | ORAL_TABLET | Freq: Once | ORAL | 0 refills | Status: AC

## 2017-06-05 ENCOUNTER — Emergency Department (HOSPITAL_COMMUNITY): Admission: EM | Admit: 2017-06-05 | Discharge: 2017-06-05 | Discharge status: Against Medical Advice | Payer: Self-pay

## 2017-06-05 NOTE — ED Notes (Signed)
Pt called x 1 no answer

## 2017-06-05 NOTE — ED Notes (Signed)
Pt states she no longer wants to be seen

## 2017-09-28 ENCOUNTER — Other Ambulatory Visit: Payer: Self-pay | Admitting: Pediatrics

## 2017-09-28 DIAGNOSIS — F331 Major depressive disorder, recurrent, moderate: Secondary | ICD-10-CM

## 2017-09-28 MED ORDER — ESCITALOPRAM OXALATE 10 MG PO TABS: 10.0000 mg | ORAL_TABLET | Freq: Every day | ORAL | 11 refills | Status: DC

## 2017-10-24 ENCOUNTER — Ambulatory Visit: Payer: Medicaid Other | Admitting: Obstetrics

## 2017-10-31 ENCOUNTER — Ambulatory Visit: Payer: Medicaid Other | Admitting: Obstetrics

## 2018-01-16 IMAGING — US US MFM OB TRANSVAGINAL
2 series · 14 of 28 positions shown · non-contrast
Comparison: none

[Series 1: us mfm ob transvaginal · 14 acquisitions, 6 frames shown (1 of 2)]
[im 2/14]
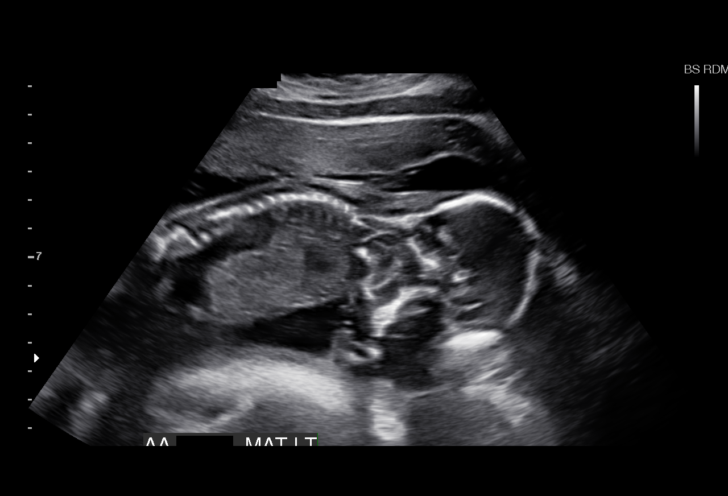
[im 4/14]
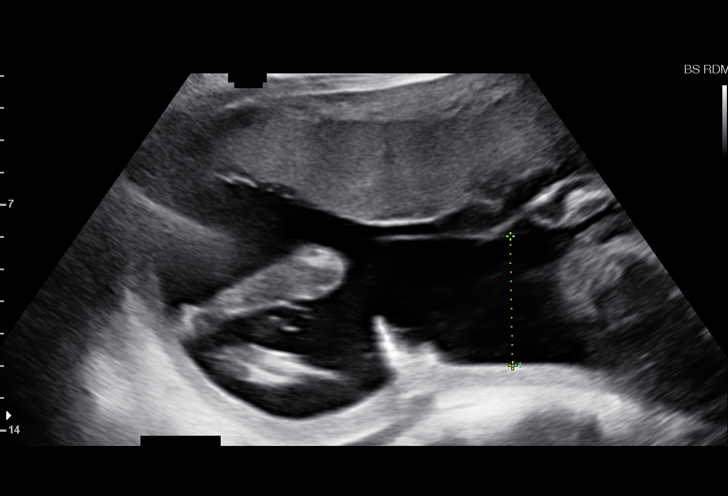
[im 6/14]
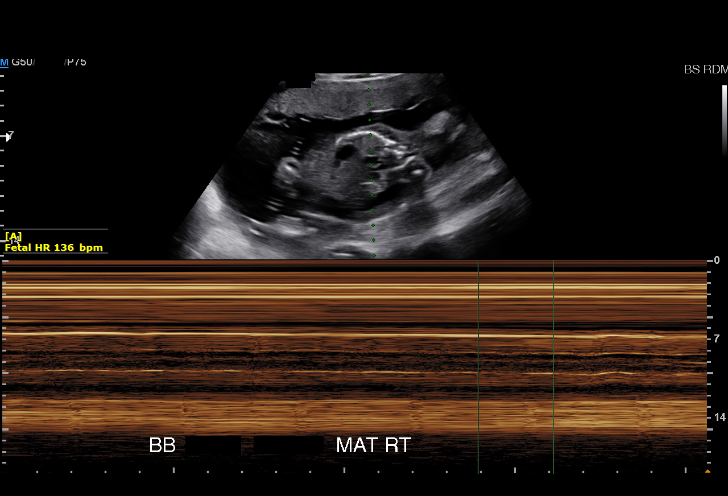
[im 9/14]
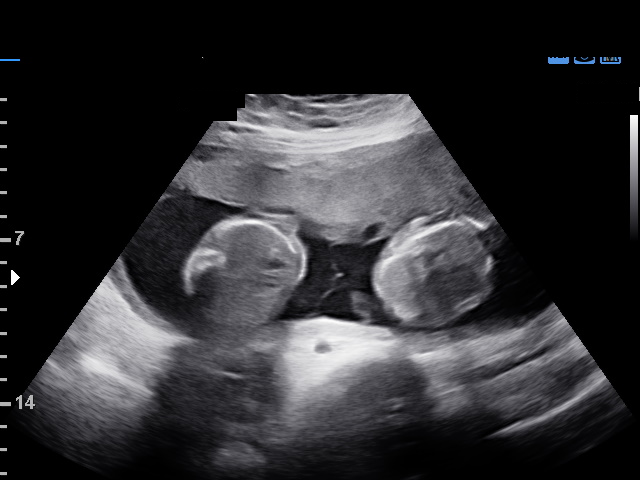
[im 11/14]
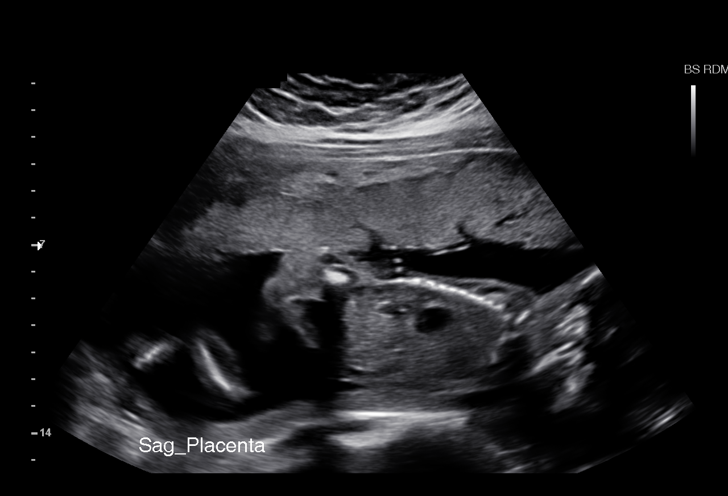
[im 14/14]
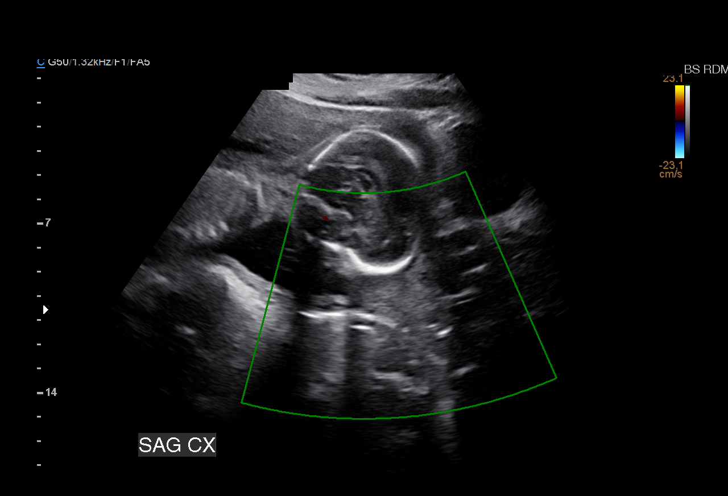

[Series 3: us mfm ob transvaginal · 18 acquisitions, 8 frames shown (2 of 2)]
[im 2/18]
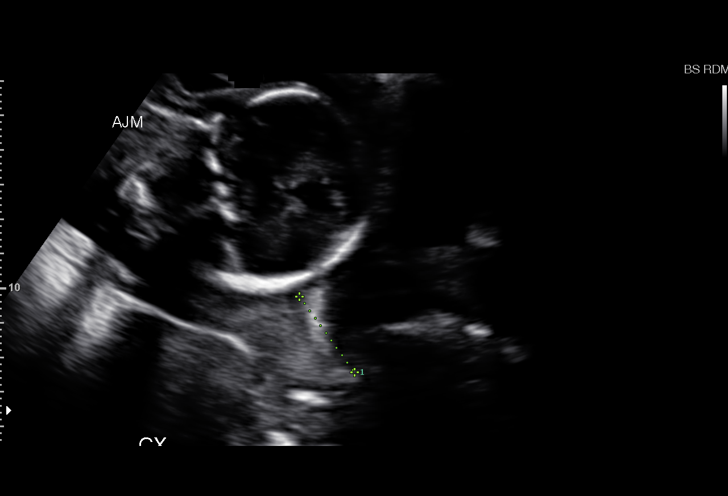
[im 4/18]
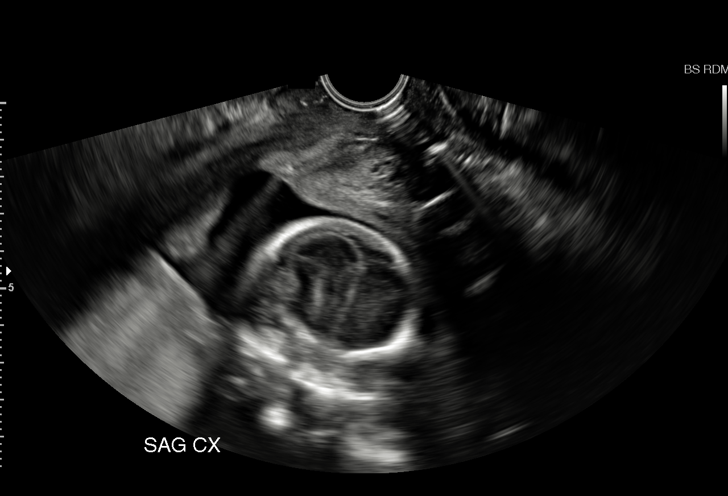
[im 6/18]
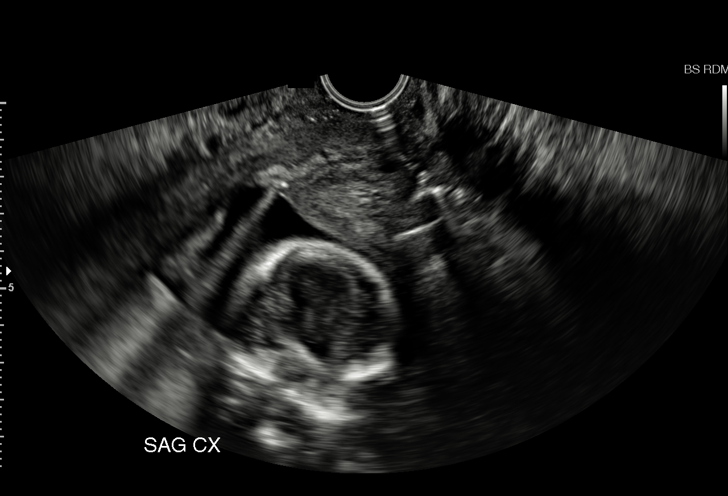
[im 8/18]
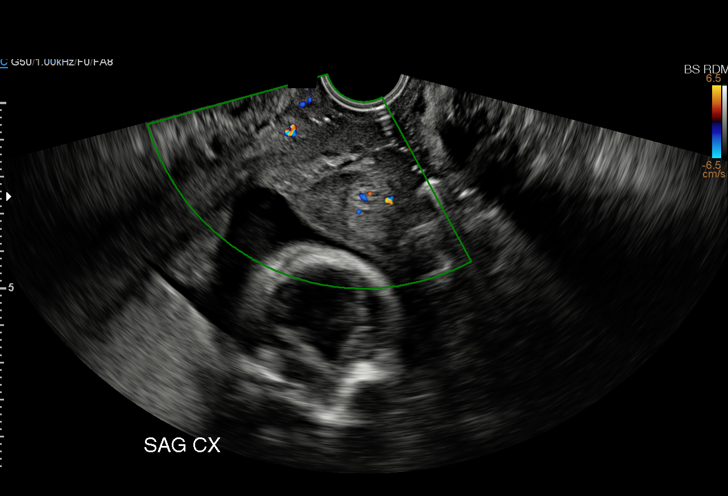
[im 11/18]
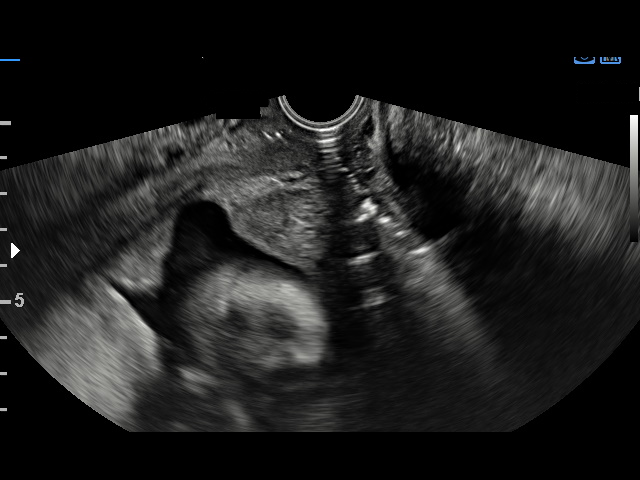
[im 13/18]
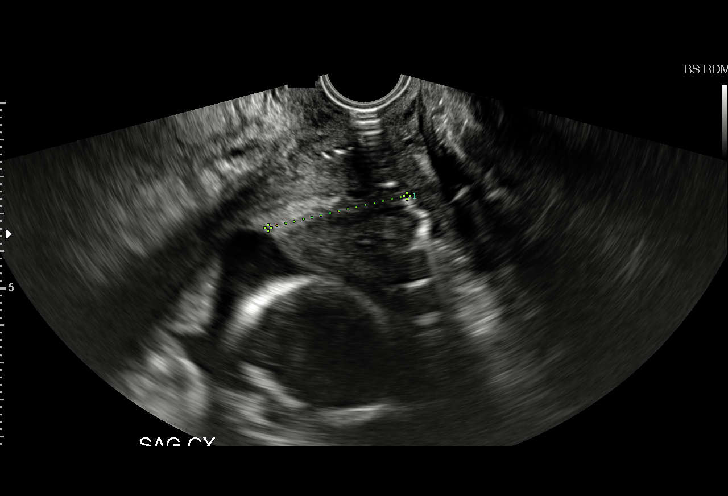
[im 15/18]
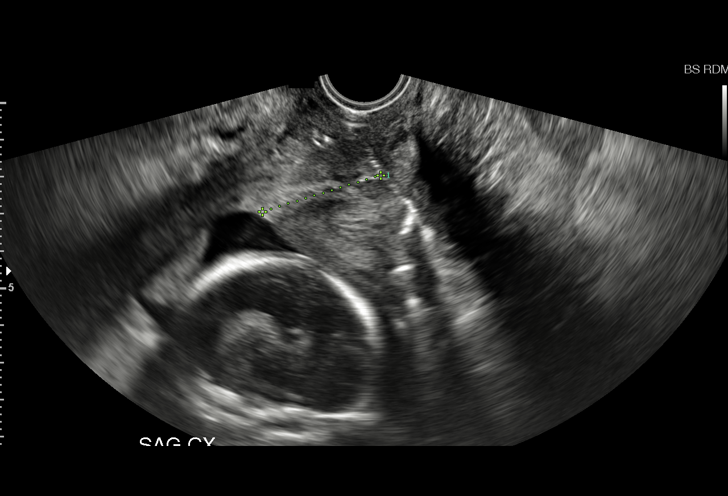
[im 18/18]
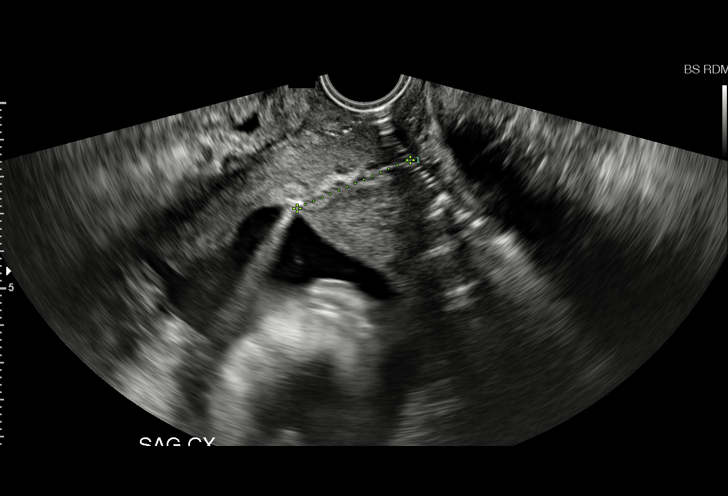

[14 of 28 positions shown; findings below may reference images not displayed]

1  SONY YOUNG            [id number]      [id number]     [id number]
2  SONY YOUNG            [id number]      [id number]     [id number]
Indications

Twin pregnancy, Abu Saleem/Jijiri, second trimester
Poor obstetric history: Previous preterm
delivery (33 weeks, PROM)
Obesity complicating pregnancy, second
trimester
20 weeks gestation of pregnancy
Previous cervical surgery (Conization with
Biopsy 2630)
OB History

Gravidity:    3         Term:   1        Prem:   1        SAB:   0
TOP:          0       Ectopic:  0        Living: 2
Fetal Evaluation (Fetus A)

Num Of Fetuses:     2
Fetal Heart         142
Rate(bpm):
Cardiac Activity:   Observed
Presentation:       Cephalic
Placenta:           Anterior, above cervical os
P. Cord Insertion:  Marginal insertion prev seen.
Membrane Desc:      Dividing Membrane seen - Monochorionic

Amniotic Fluid
AFI FV:      Subjectively within normal limits
Larg Pckt:       4  cm
Gestational Age (Fetus A)
LMP:           19w 3d        Date:  05/10/15                 EDD:   02/14/16
Best:          20w 5d     Det. By:  Early Ultrasound         EDD:   02/05/16
(07/29/15)

Fetal Evaluation (Fetus B)

Num Of Fetuses:     2
Fetal Heart         136
Rate(bpm):
Cardiac Activity:   Observed
Fetal Lie:          Upper Fetus Maternal Right Side
Presentation:       Cephalic
Placenta:           Anterior, above cervical os
P. Cord Insertion:  Previously seen as normal
Membrane Desc:      Dividing Membrane seen - Monochorionic

Amniotic Fluid
AFI FV:      Subjectively within normal limits
Larg Pckt:     4.7  cm
Gestational Age (Fetus B)

LMP:           19w 3d        Date:  05/10/15                 EDD:   02/14/16
Best:          20w 5d     Det. By:  Early Ultrasound         EDD:   02/05/16
(07/29/15)
Cervix Uterus Adnexa

Cervix
Length:            3.3  cm.
Normal appearance by transvaginal scan
Impression

Monochorionic/diamniotic twin pregnancy at 20+5 weeks
Normal limited fetal anatomy x 2
Normal amniotic fluid volume x 2
EV views of cervix: normal length without funneling
No evidence of TTTS
Recommendations

Growth US in 2 weeks with CL

## 2018-01-23 ENCOUNTER — Other Ambulatory Visit: Payer: Self-pay | Admitting: Obstetrics

## 2018-01-23 DIAGNOSIS — I10 Essential (primary) hypertension: Secondary | ICD-10-CM

## 2018-02-20 ENCOUNTER — Telehealth: Payer: Self-pay

## 2018-02-20 NOTE — Telephone Encounter (Signed)
Pt called wanting to know if her dosage can be increased on her Lexapro. She states that her current dosage is not working for her. She has increased her dose to twice daily which has been working for her. She takes one at 7 am and 7 pm. She states that she does not want to change prescriptions because she does not want to go through the side effects with starting a new medication.

## 2018-02-21 ENCOUNTER — Other Ambulatory Visit: Payer: Self-pay | Admitting: Obstetrics

## 2018-02-21 DIAGNOSIS — F331 Major depressive disorder, recurrent, moderate: Secondary | ICD-10-CM

## 2018-02-21 MED ORDER — ESCITALOPRAM OXALATE 10 MG PO TABS
10.0000 mg | ORAL_TABLET | Freq: Two times a day (BID) | ORAL | 11 refills | Status: DC
Start: 2018-02-21 — End: 2019-03-25

## 2018-02-21 NOTE — Progress Notes (Signed)
Pt made aware of medication changes. Pt made aware to discontinue Ibuprofen due to GI interactions. Pt states understanding.

## 2018-03-06 ENCOUNTER — Other Ambulatory Visit (HOSPITAL_COMMUNITY)
Admission: RE | Admit: 2018-03-06 | Discharge: 2018-03-06 | Disposition: A | Discharge status: Home / Self Care | Payer: Medicaid Other | Source: Ambulatory Visit | Attending: Obstetrics | Admitting: Obstetrics

## 2018-03-06 ENCOUNTER — Ambulatory Visit (INDEPENDENT_AMBULATORY_CARE_PROVIDER_SITE_OTHER): Payer: Medicaid Other | Admitting: Obstetrics

## 2018-03-06 ENCOUNTER — Encounter: Payer: Self-pay | Admitting: Obstetrics

## 2018-03-06 VITALS — BP 138/86 | HR 61 | Ht 67.0 in | Wt 222.0 lb

## 2018-03-06 DIAGNOSIS — N898 Other specified noninflammatory disorders of vagina: Secondary | ICD-10-CM

## 2018-03-06 DIAGNOSIS — Z01419 Encounter for gynecological examination (general) (routine) without abnormal findings: Secondary | ICD-10-CM

## 2018-03-06 DIAGNOSIS — Z113 Encounter for screening for infections with a predominantly sexual mode of transmission: Secondary | ICD-10-CM | POA: Diagnosis not present

## 2018-03-06 DIAGNOSIS — Z0000 Encounter for general adult medical examination without abnormal findings: Secondary | ICD-10-CM | POA: Diagnosis not present

## 2018-03-06 DIAGNOSIS — G44221 Chronic tension-type headache, intractable: Secondary | ICD-10-CM

## 2018-03-06 MED ORDER — ACETAMINOPHEN-CODEINE #3 300-30 MG PO TABS: 1.0000 | ORAL_TABLET | ORAL | 0 refills | Status: DC | PRN

## 2018-03-06 NOTE — Progress Notes (Signed)
Subjective:        Breanna Yates is a 32 y.o. female here for a routine exam.  Current complaints: None.    Personal health questionnaire:  Is patient Ashkenazi Jewish, have a family history of breast and/or ovarian cancer: no Is there a family history of uterine cancer diagnosed at age < 58, gastrointestinal cancer, urinary tract cancer, family member who is a Field seismologist syndrome-associated carrier: no Is the patient overweight and hypertensive, family history of diabetes, personal history of gestational diabetes, preeclampsia or PCOS: no Is patient over 27, have PCOS,  family history of premature CHD under age 63, diabetes, smoke, have hypertension or peripheral artery disease:  no At any time, has a partner hit, kicked or otherwise hurt or frightened you?: no Over the past 2 weeks, have you felt down, depressed or hopeless?: no Over the past 2 weeks, have you felt little interest or pleasure in doing things?:no   Gynecologic History No LMP recorded. Contraception: tubal ligation Last Pap: 2018. Results were: normal Last mammogram: n/a. Results were: n/a  Obstetric History OB History  Gravida Para Term Preterm AB Living  3 3 1 2  0 4  SAB TAB Ectopic Multiple Live Births  0 0 0 1 4    # Outcome Date GA Lbr Len/2nd Weight Sex Delivery Anes PTL Lv  3A Preterm 12/05/15 [redacted]w[redacted]d  3 lb 7.4 oz (1.57 kg) M Vag-Spont None  LIV  3B Preterm 12/05/15 [redacted]w[redacted]d  3 lb 2.1 oz (1.42 kg) M Vag-Spont None  LIV  2 Preterm 02/11/15 [redacted]w[redacted]d    Vag-Spont   LIV  1 Term 01/12/12 [redacted]w[redacted]d 06:59 / 00:41 6 lb 15.6 oz (3.165 kg) F Vag-Spont EPI  LIV     Birth Comments: none    Past Medical History:  Diagnosis Date  . Chlamydia 2008  . CIN III (cervical intraepithelial neoplasia grade III) with severe dysplasia   . Depression    mild per pt- overwhelmed and tired  . GDM (gestational diabetes mellitus) 11/26/2015  . History of gestational diabetes mellitus (GDM)   . Hypertension     Past Surgical History:   Procedure Laterality Date  . CERVICAL CONIZATION W/BX N/A 09/18/2013   Procedure: CONIZATION CERVIX;  Surgeon: Frederico Hamman, MD;  Location: [facility];  Service: Gynecology;  Laterality: N/A;  . LAPAROSCOPIC TUBAL LIGATION N/A 03/01/2016   Procedure: LAPAROSCOPIC TUBAL LIGATION WITH FILSHIE CLIPS;  Surgeon: Osborne Oman, MD;  Location: [facility];  Service: Gynecology;  Laterality: N/A;  . TONGUE SURGERY    . WISDOM TOOTH EXTRACTION       Current Outpatient Medications:  .  escitalopram (LEXAPRO) 10 MG tablet, Take 1 tablet (10 mg total) by mouth 2 (two) times daily., Disp: 60 tablet, Rfl: 11 .  hydrochlorothiazide (HYDRODIURIL) 25 MG tablet, TAKE 1 TABLET BY MOUTH EVERY DAY, Disp: 30 tablet, Rfl: 11 .  acetaminophen-codeine (TYLENOL #3) 300-30 MG tablet, Take 1 tablet by mouth every 4 (four) hours as needed for moderate pain. (Patient not taking: Reported on 03/06/2018), Disp: 30 tablet, Rfl: 0 .  BLISOVI 24 FE 1-20 MG-MCG(24) tablet, Take 1 tablet by mouth daily., Disp: , Rfl: 12 .  doxycycline (VIBRAMYCIN) 100 MG capsule, Take 1 capsule (100 mg total) by mouth 2 (two) times daily. (Patient not taking: Reported on 02/23/2017), Disp: 14 capsule, Rfl: 0 .  hydrochlorothiazide (HYDRODIURIL) 12.5 MG tablet, Take 2 tablets (25 mg total) by mouth daily. (Patient not taking: Reported on 02/23/2017), Disp: 30  tablet, Rfl: 2 .  metroNIDAZOLE (FLAGYL) 500 MG tablet, Take 1 tablet (500 mg total) by mouth 2 (two) times daily. (Patient not taking: Reported on 03/06/2018), Disp: 14 tablet, Rfl: 2 Allergies  Allergen Reactions  . Pineapple Itching    Oral itching, no swelling    Social History   Tobacco Use  . Smoking status: Current Every Day Smoker    Packs/day: 0.25    Years: 13.00    Pack years: 3.25    Types: Cigarettes  . Smokeless tobacco: Never Used  Substance Use Topics  . Alcohol use: No    Family History  Problem Relation Age of Onset  . Hypertension Mother   . Asthma Sister   . Cancer  Sister        brain tumor and seizures  . Hypertension Maternal Grandmother   . Diabetes Maternal Grandmother       Review of Systems  Constitutional: negative for fatigue and weight loss Respiratory: negative for cough and wheezing Cardiovascular: negative for chest pain, fatigue and palpitations Gastrointestinal: negative for abdominal pain and change in bowel habits Musculoskeletal:negative for myalgias Neurological: negative for gait problems and tremors Behavioral/Psych: negative for abusive relationship, depression Endocrine: negative for temperature intolerance    Genitourinary:negative for abnormal menstrual periods, genital lesions, hot flashes, sexual problems and vaginal discharge Integument/breast: negative for breast lump, breast tenderness, nipple discharge and skin lesion(s)    Objective:       BP 138/86   Pulse 61   Ht 5\' 7"  (1.702 m)   Wt 222 lb (100.7 kg)   BMI 34.77 kg/m  General:   alert  Skin:   no rash or abnormalities  Lungs:   clear to auscultation bilaterally  Heart:   regular rate and rhythm, S1, S2 normal, no murmur, click, rub or gallop  Breasts:   normal without suspicious masses, skin or nipple changes or axillary nodes  Abdomen:  normal findings: no organomegaly, soft, non-tender and no hernia  Pelvis:  External genitalia: normal general appearance Urinary system: urethral meatus normal and bladder without fullness, nontender Vaginal: normal without tenderness, induration or masses Cervix: normal appearance Adnexa: normal bimanual exam Uterus: anteverted and non-tender, normal size   Lab Review Urine pregnancy test Labs reviewed yes Radiologic studies reviewed no  50% of 20 min visit spent on counseling and coordination of care.   Assessment:   1. Encounter for routine gynecological examination with Papanicolaou smear of cervix Rx: - Cytology - PAP  2. Vaginal discharge Rx: - Cervicovaginal ancillary only  3. Screening for STD  (sexually transmitted disease) Rx: - HIV antibody (with reflex) - RPR - Hepatitis B Surface AntiGEN - Hepatitis C Antibody  4. Chronic tension-type headache, intractable Rx: - acetaminophen-codeine (TYLENOL #3) 300-30 MG tablet; Take 1 tablet by mouth every 4 (four) hours as needed for moderate pain.  Dispense: 30 tablet; Refill: 0  5. Routine adult health maintenance Rx: - Ambulatory referral to Internal Medicine   Plan:    Education reviewed: calcium supplements, depression evaluation, low fat, low cholesterol diet, safe sex/STD prevention, self breast exams, smoking cessation and weight bearing exercise. Contraception: tubal ligation. Follow up in: 1 year.   No orders of the defined types were placed in this encounter.  Orders Placed This Encounter  Procedures  . HIV antibody (with reflex)  . RPR  . Hepatitis B Surface AntiGEN  . Hepatitis C Antibody    Shelly Bombard MD 03-06-2018

## 2018-03-06 NOTE — Progress Notes (Signed)
Patient is in the office for annual, last pap 02-23-17. Pt wants std testing.

## 2018-03-07 LAB — RPR: RPR Ser Ql: NONREACTIVE

## 2018-03-07 LAB — HEPATITIS C ANTIBODY: Hep C Virus Ab: <0.1 s/co ratio (ref 0.0–0.9)

## 2018-03-07 LAB — CERVICOVAGINAL ANCILLARY ONLY
Bacterial vaginitis: POSITIVE — AB
CANDIDA VAGINITIS: NEGATIVE
CHLAMYDIA, DNA PROBE: NEGATIVE
Neisseria Gonorrhea: NEGATIVE
TRICH (WINDOWPATH): POSITIVE — AB

## 2018-03-07 LAB — HIV ANTIBODY (ROUTINE TESTING W REFLEX): HIV Screen 4th Generation wRfx: NONREACTIVE

## 2018-03-07 LAB — CYTOLOGY - PAP: DIAGNOSIS: NEGATIVE

## 2018-03-07 LAB — HEPATITIS B SURFACE ANTIGEN: Hepatitis B Surface Ag: NEGATIVE

## 2018-03-08 ENCOUNTER — Other Ambulatory Visit: Payer: Self-pay | Admitting: Obstetrics

## 2018-03-08 DIAGNOSIS — N760 Acute vaginitis: Principal | ICD-10-CM

## 2018-03-08 DIAGNOSIS — A5901 Trichomonal vulvovaginitis: Secondary | ICD-10-CM

## 2018-03-08 DIAGNOSIS — B9689 Other specified bacterial agents as the cause of diseases classified elsewhere: Secondary | ICD-10-CM

## 2018-03-08 MED ORDER — TINIDAZOLE 500 MG PO TABS: 2.0000 g | ORAL_TABLET | Freq: Every day | ORAL | 0 refills | Status: DC

## 2018-04-09 ENCOUNTER — Emergency Department (HOSPITAL_COMMUNITY): Payer: Medicaid Other

## 2018-04-09 ENCOUNTER — Encounter (HOSPITAL_COMMUNITY): Payer: Self-pay | Admitting: Family Medicine

## 2018-04-09 ENCOUNTER — Emergency Department (HOSPITAL_COMMUNITY)
Admission: EM | Admit: 2018-04-09 | Discharge: 2018-04-09 | Disposition: A | Discharge status: Home / Self Care | Payer: Medicaid Other | Attending: Emergency Medicine | Admitting: Emergency Medicine

## 2018-04-09 DIAGNOSIS — I10 Essential (primary) hypertension: Secondary | ICD-10-CM | POA: Insufficient documentation

## 2018-04-09 DIAGNOSIS — M79671 Pain in right foot: Secondary | ICD-10-CM | POA: Diagnosis not present

## 2018-04-09 DIAGNOSIS — F17210 Nicotine dependence, cigarettes, uncomplicated: Secondary | ICD-10-CM | POA: Diagnosis not present

## 2018-04-09 DIAGNOSIS — Z79899 Other long term (current) drug therapy: Secondary | ICD-10-CM | POA: Insufficient documentation

## 2018-04-09 DIAGNOSIS — Y9389 Activity, other specified: Secondary | ICD-10-CM | POA: Insufficient documentation

## 2018-04-09 DIAGNOSIS — S93601A Unspecified sprain of right foot, initial encounter: Secondary | ICD-10-CM | POA: Diagnosis not present

## 2018-04-09 DIAGNOSIS — Y929 Unspecified place or not applicable: Secondary | ICD-10-CM | POA: Insufficient documentation

## 2018-04-09 DIAGNOSIS — S80212A Abrasion, left knee, initial encounter: Secondary | ICD-10-CM | POA: Insufficient documentation

## 2018-04-09 DIAGNOSIS — Y999 Unspecified external cause status: Secondary | ICD-10-CM | POA: Diagnosis not present

## 2018-04-09 DIAGNOSIS — S9031XA Contusion of right foot, initial encounter: Secondary | ICD-10-CM | POA: Diagnosis not present

## 2018-04-09 DIAGNOSIS — W0110XA Fall on same level from slipping, tripping and stumbling with subsequent striking against unspecified object, initial encounter: Secondary | ICD-10-CM | POA: Insufficient documentation

## 2018-04-09 DIAGNOSIS — S99921A Unspecified injury of right foot, initial encounter: Secondary | ICD-10-CM | POA: Diagnosis present

## 2018-04-09 MED ORDER — BACITRACIN ZINC 500 UNIT/GM EX OINT
TOPICAL_OINTMENT | Freq: Two times a day (BID) | CUTANEOUS | Status: DC
  Administered 2018-04-09: 16:00:00 via TOPICAL
  Filled 2018-04-09: qty 0.9

## 2018-04-09 MED ORDER — NAPROXEN 375 MG PO TABS: 375.0000 mg | ORAL_TABLET | Freq: Two times a day (BID) | ORAL | 0 refills | Status: DC

## 2018-04-09 NOTE — ED Notes (Signed)
Pt heard yelling on the phone about how annoyed and tired she is of being here. RN apologized for the wait times and told her we would get her going soon. Pt still unhappy.
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
## 2018-04-09 NOTE — ED Provider Notes (Signed)
[facility] DEPT Provider Note   CSN: [id number] Arrival date & time: 04/09/18  1311     History   Chief Complaint Chief Complaint  Patient presents with  . Foot Injury    HPI Breanna Yates is a 32 y.o. female who presents to the ED with foot pain. Patient reports she had slippers on, attempted to get up in her vehicle. She slipped, attempted to catch her self, but slipped off of the drive way. Patient c/o abrasions to the L knee and right foot pain.  HPI  Past Medical History:  Diagnosis Date  . Chlamydia 2008  . CIN III (cervical intraepithelial neoplasia grade III) with severe dysplasia   . Depression    mild per pt- overwhelmed and tired  . GDM (gestational diabetes mellitus) 11/26/2015  . History of gestational diabetes mellitus (GDM)   . Hypertension     Patient Active Problem List   Diagnosis Date Noted  . Encounter for sterilization     Past Surgical History:  Procedure Laterality Date  . CERVICAL CONIZATION W/BX N/A 09/18/2013   Procedure: CONIZATION CERVIX;  Surgeon: Frederico Hamman, MD;  Location: [facility];  Service: Gynecology;  Laterality: N/A;  . LAPAROSCOPIC TUBAL LIGATION N/A 03/01/2016   Procedure: LAPAROSCOPIC TUBAL LIGATION WITH FILSHIE CLIPS;  Surgeon: Osborne Oman, MD;  Location: [facility];  Service: Gynecology;  Laterality: N/A;  . TONGUE SURGERY    . WISDOM TOOTH EXTRACTION       OB History    Gravida  3   Para  3   Term  1   Preterm  2   AB  0   Living  4     SAB  0   TAB  0   Ectopic  0   Multiple  1   Live Births  4            Home Medications    Prior to Admission medications   Medication Sig Start Date End Date Taking? Authorizing Provider  acetaminophen-codeine (TYLENOL #3) 300-30 MG tablet Take 1 tablet by mouth every 4 (four) hours as needed for moderate pain. 03/06/18   Shelly Bombard, MD  BLISOVI 24 FE 1-20 MG-MCG(24) tablet Take 1 tablet by mouth daily. 01/14/16    [provider]  escitalopram (LEXAPRO) 10 MG tablet Take 1 tablet (10 mg total) by mouth 2 (two) times daily. 02/21/18   Shelly Bombard, MD  hydrochlorothiazide (HYDRODIURIL) 25 MG tablet TAKE 1 TABLET BY MOUTH EVERY DAY 01/23/18   Shelly Bombard, MD  naproxen (NAPROSYN) 375 MG tablet Take 1 tablet (375 mg total) by mouth 2 (two) times daily. 04/09/18   Ashley Murrain, NP  tinidazole (TINDAMAX) 500 MG tablet Take 4 tablets (2,000 mg total) by mouth daily with breakfast. 03/08/18   Shelly Bombard, MD    Family History Family History  Problem Relation Age of Onset  . Hypertension Mother   . Asthma Sister   . Cancer Sister        brain tumor and seizures  . Hypertension Maternal Grandmother   . Diabetes Maternal Grandmother     Social History Social History   Tobacco Use  . Smoking status: Current Every Day Smoker    Packs/day: 0.50    Years: 13.00    Pack years: 6.50    Types: Cigarettes  . Smokeless tobacco: Never Used  Substance Use Topics  . Alcohol use: Yes  Comment: "Birthdays, Holidays"  . Drug use: No     Allergies   Pineapple   Review of Systems Review of Systems  Musculoskeletal: Positive for arthralgias.  Skin: Positive for wound.  All other systems reviewed and are negative.    Physical Exam Updated Vital Signs BP (!) 149/84 (BP Location: Left Arm)   Pulse 62   Temp 99.1 F (37.3 C) (Oral)   Resp 17   Ht 5\' 7"  (1.702 m)   Wt 99.8 kg   SpO2 100%   BMI 34.46 kg/m   Physical Exam  Constitutional: She appears well-developed and well-nourished. No distress.  HENT:  Head: Normocephalic and atraumatic.  Eyes: EOM are normal.  Neck: Neck supple.  Cardiovascular: Normal rate.  Pulmonary/Chest: Effort normal.  Musculoskeletal:       Right foot: There is tenderness. There is no deformity. Decreased range of motion: due to pain.  Tender with palpation to the lateral aspect of the right foot. Mild swelling noted. Pedal pulse 2+, adequate  circulation. Left knee with abrasion.  Neurological: She is alert.  Skin: Skin is warm and dry.  Psychiatric: She has a normal mood and affect.  Nursing note and vitals reviewed.    ED Treatments / Results  Labs (all labs ordered are listed, but only abnormal results are displayed) Labs Reviewed - No data to display  EKG Radiology Dg Foot Complete Right  Result Date: 04/09/2018 CLINICAL DATA:  Posttraumatic right foot pain EXAM: RIGHT FOOT COMPLETE - 3+ VIEW COMPARISON:  None. FINDINGS: There is no evidence of fracture or dislocation. There is no evidence of arthropathy or other focal bone abnormality. Soft tissues are unremarkable. IMPRESSION: Negative. Electronically Signed   By: Monte Fantasia M.D.   On: 04/09/2018 14:26    Procedures Procedures (including critical care time)  Medications Ordered in ED Medications - No data to display   Initial Impression / Assessment and Plan / ED Course  I have reviewed the triage vital signs and the nursing notes.  32 y.o. female here with right foot pain s/p fall and abrasion to the left knee. Patient stable for d/c after wound cleaned and bacitracin ointment and dressing applied. Compression splint to the right foot, crutches. Return precautions.  Final Clinical Impressions(s) / ED Diagnoses   Final diagnoses:  Contusion of right foot, initial encounter  Sprain of right foot, initial encounter  Abrasion of left knee, initial encounter    ED Discharge Orders         Ordered    naproxen (NAPROSYN) 375 MG tablet  2 times daily     04/09/18 [place], [place], NP 04/09/18 2249    Merrily Pew, MD 04/10/18 [phone]

## 2018-06-28 ENCOUNTER — Encounter (HOSPITAL_COMMUNITY): Payer: Self-pay | Admitting: Emergency Medicine

## 2018-06-28 ENCOUNTER — Other Ambulatory Visit: Payer: Self-pay

## 2018-06-28 ENCOUNTER — Emergency Department (HOSPITAL_COMMUNITY)
Admission: EM | Admit: 2018-06-28 | Discharge: 2018-06-28 | Disposition: A | Discharge status: Home / Self Care | Payer: Medicaid Other | Attending: Emergency Medicine | Admitting: Emergency Medicine

## 2018-06-28 DIAGNOSIS — M546 Pain in thoracic spine: Secondary | ICD-10-CM | POA: Diagnosis not present

## 2018-06-28 DIAGNOSIS — F17210 Nicotine dependence, cigarettes, uncomplicated: Secondary | ICD-10-CM | POA: Diagnosis not present

## 2018-06-28 DIAGNOSIS — Z79899 Other long term (current) drug therapy: Secondary | ICD-10-CM | POA: Diagnosis not present

## 2018-06-28 DIAGNOSIS — I10 Essential (primary) hypertension: Secondary | ICD-10-CM | POA: Diagnosis not present

## 2018-06-28 NOTE — ED Notes (Signed)
Patient verbalizes understanding of discharge instructions. Opportunity for questioning and answers were provided. Armband removed by staff, pt discharged from ED ambulatory.   
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
## 2018-06-28 NOTE — Discharge Instructions (Signed)
Ibuprofen for soreness.  Return if any problems.  

## 2018-09-25 DIAGNOSIS — Z136 Encounter for screening for cardiovascular disorders: Secondary | ICD-10-CM | POA: Diagnosis not present

## 2018-09-25 DIAGNOSIS — R7303 Prediabetes: Secondary | ICD-10-CM | POA: Diagnosis not present

## 2018-09-25 DIAGNOSIS — Z131 Encounter for screening for diabetes mellitus: Secondary | ICD-10-CM | POA: Diagnosis not present

## 2018-09-25 DIAGNOSIS — I10 Essential (primary) hypertension: Secondary | ICD-10-CM | POA: Diagnosis not present

## 2018-09-25 DIAGNOSIS — H539 Unspecified visual disturbance: Secondary | ICD-10-CM | POA: Diagnosis not present

## 2018-09-25 DIAGNOSIS — Z5181 Encounter for therapeutic drug level monitoring: Secondary | ICD-10-CM | POA: Diagnosis not present

## 2018-09-25 DIAGNOSIS — E669 Obesity, unspecified: Secondary | ICD-10-CM | POA: Diagnosis not present

## 2018-09-25 DIAGNOSIS — Z720 Tobacco use: Secondary | ICD-10-CM | POA: Diagnosis not present

## 2018-09-25 DIAGNOSIS — Z113 Encounter for screening for infections with a predominantly sexual mode of transmission: Secondary | ICD-10-CM | POA: Diagnosis not present

## 2018-09-25 DIAGNOSIS — Z01118 Encounter for examination of ears and hearing with other abnormal findings: Secondary | ICD-10-CM | POA: Diagnosis not present

## 2018-10-09 DIAGNOSIS — Z720 Tobacco use: Secondary | ICD-10-CM | POA: Diagnosis not present

## 2018-10-09 DIAGNOSIS — E782 Mixed hyperlipidemia: Secondary | ICD-10-CM | POA: Diagnosis not present

## 2018-10-09 DIAGNOSIS — E669 Obesity, unspecified: Secondary | ICD-10-CM | POA: Diagnosis not present

## 2018-10-09 DIAGNOSIS — I10 Essential (primary) hypertension: Secondary | ICD-10-CM | POA: Diagnosis not present

## 2018-10-09 DIAGNOSIS — R7303 Prediabetes: Secondary | ICD-10-CM | POA: Diagnosis not present

## 2019-02-07 ENCOUNTER — Other Ambulatory Visit: Payer: Self-pay | Admitting: Obstetrics

## 2019-02-07 DIAGNOSIS — I10 Essential (primary) hypertension: Secondary | ICD-10-CM

## 2019-02-08 NOTE — Telephone Encounter (Signed)
Please review for refill.  

## 2019-02-15 DIAGNOSIS — H40033 Anatomical narrow angle, bilateral: Secondary | ICD-10-CM | POA: Diagnosis not present

## 2019-02-15 DIAGNOSIS — H16223 Keratoconjunctivitis sicca, not specified as Sjogren's, bilateral: Secondary | ICD-10-CM | POA: Diagnosis not present

## 2019-03-23 ENCOUNTER — Other Ambulatory Visit: Payer: Self-pay | Admitting: Obstetrics

## 2019-03-23 DIAGNOSIS — F331 Major depressive disorder, recurrent, moderate: Secondary | ICD-10-CM

## 2019-05-15 IMAGING — US US TRANSVAGINAL NON-OB
1 series · 15 of 25 positions shown · non-contrast
Comparison: None

CLINICAL DATA: Pelvic pain



[Series 1: us transvaginal non-ob · 15 of 76 slices shown]
[im 1/76]
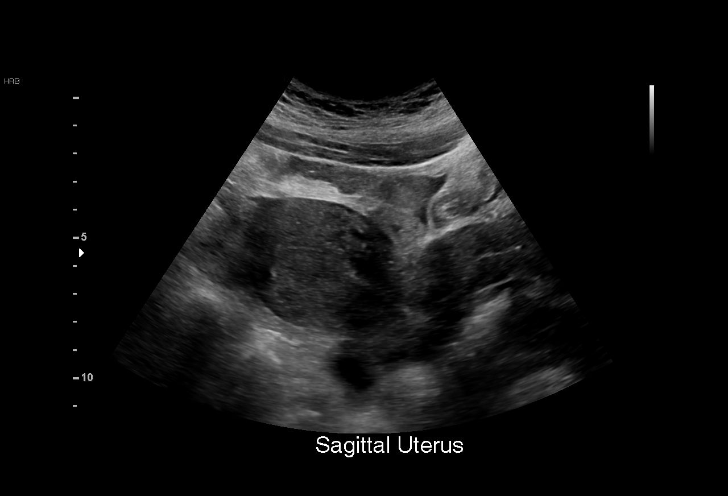
[im 7/76]
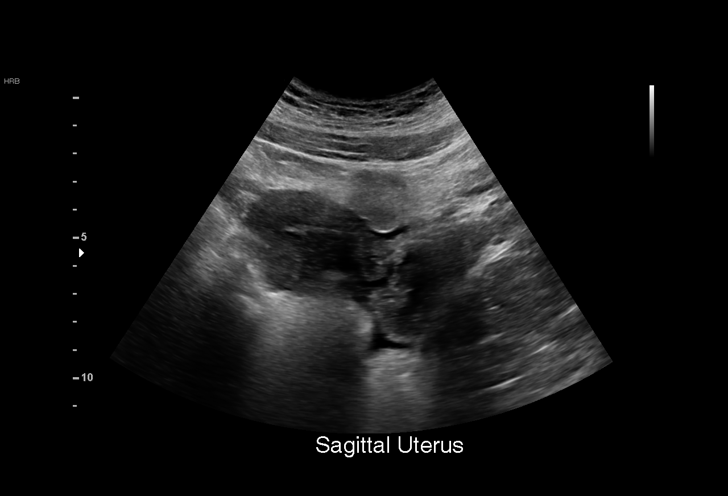
[im 13/76]
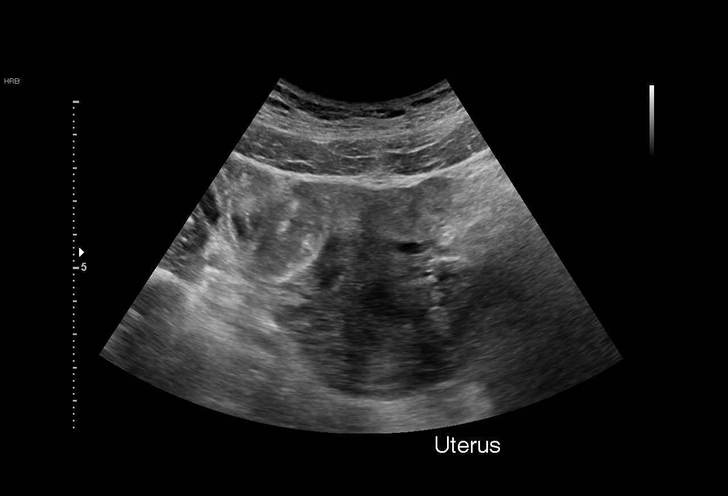
[im 16/76]
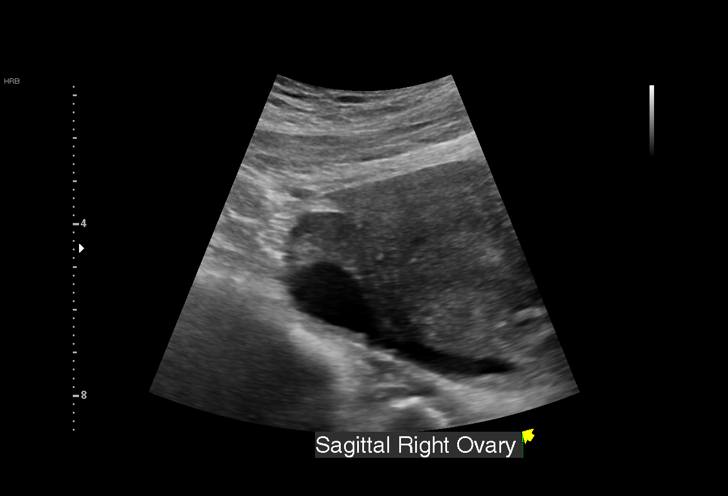
[im 22/76]
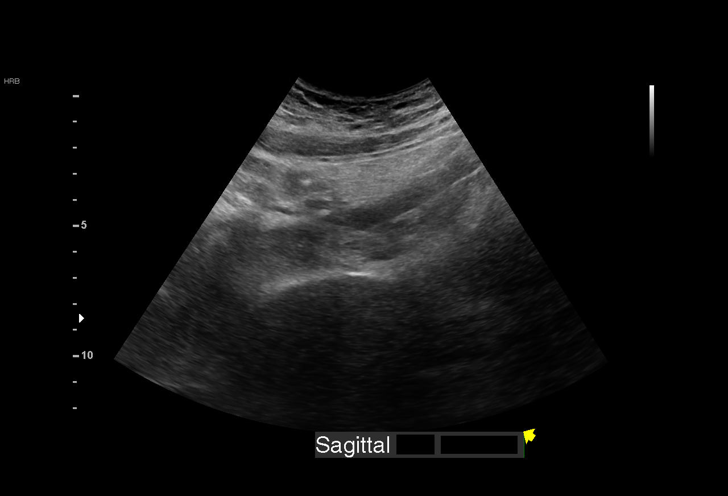
[im 29/76]
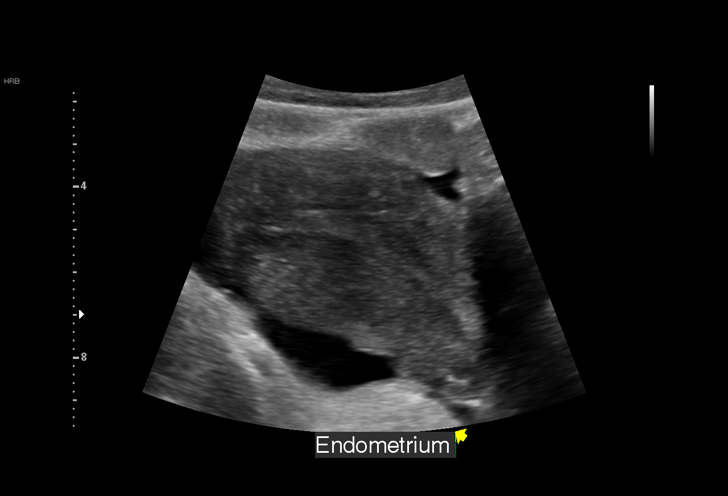
[im 32/76]
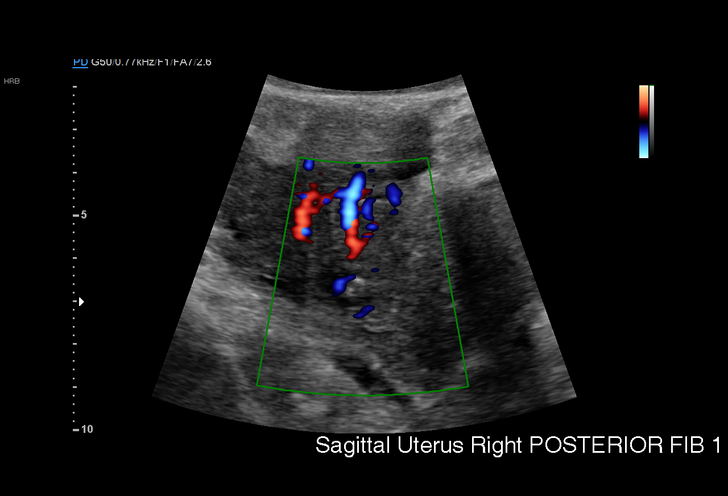
[im 38/76]
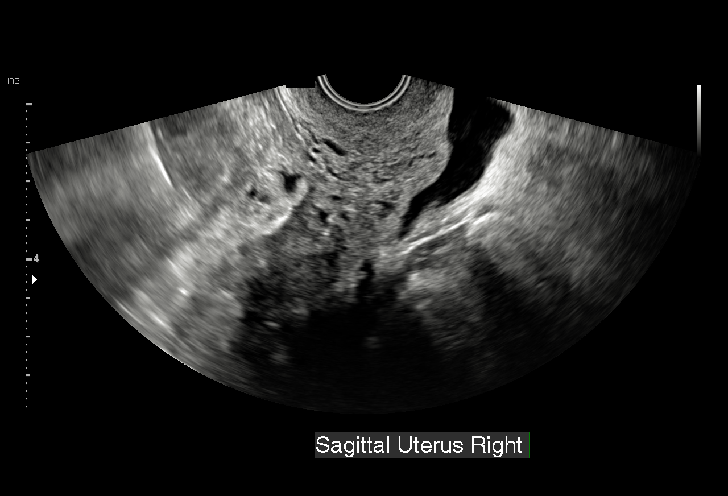
[im 44/76]
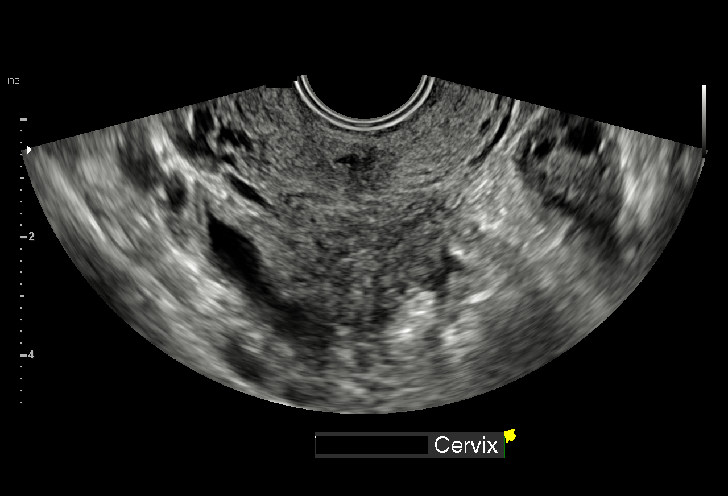
[im 47/76]
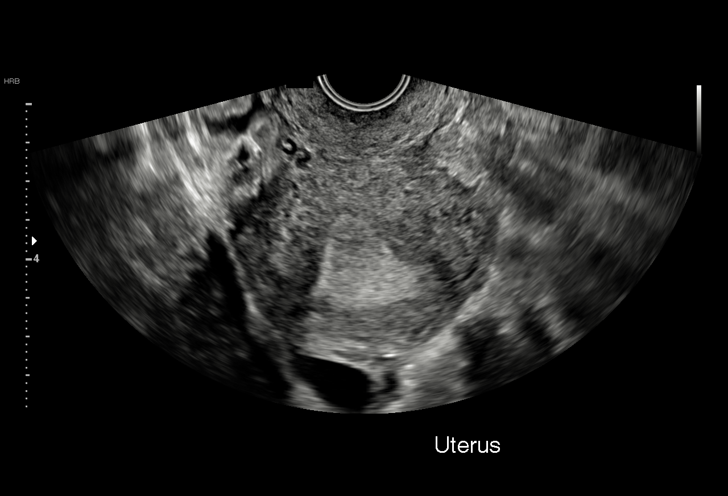
[im 54/76]
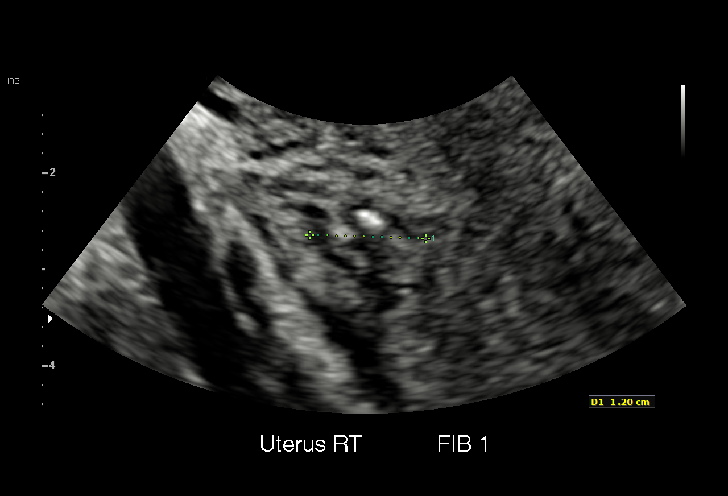
[im 60/76]
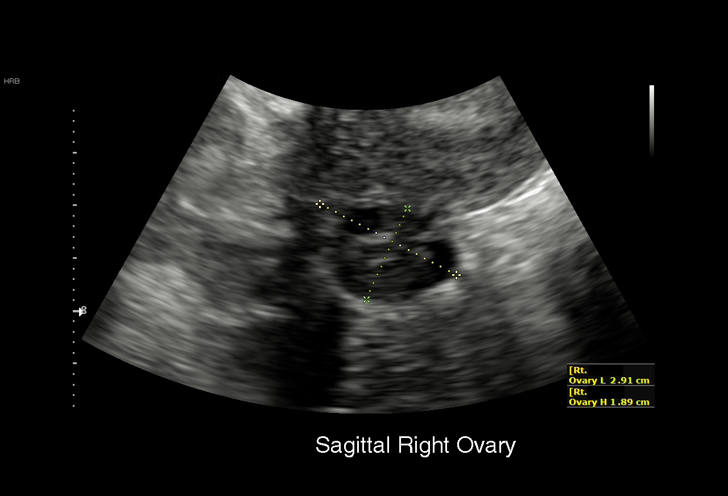
[im 63/76]
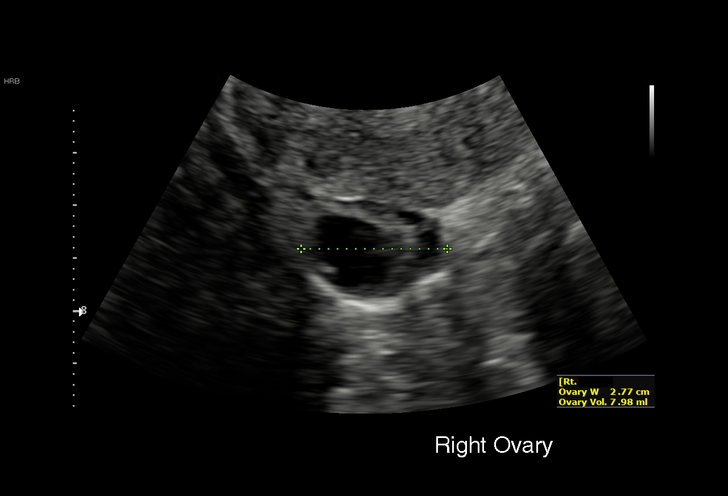
[im 69/76]
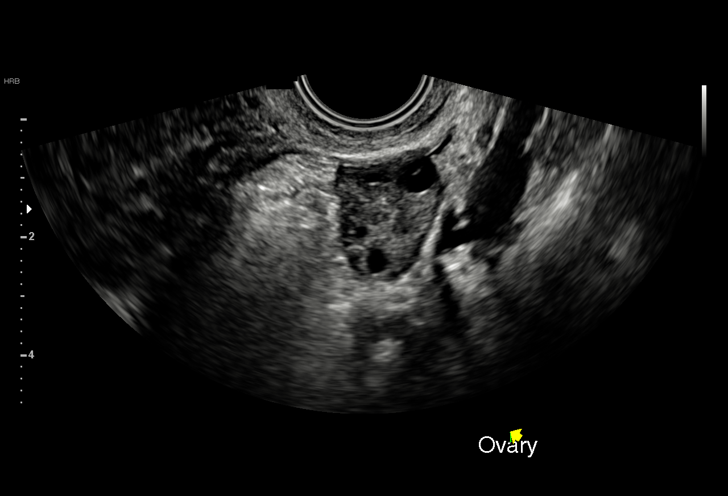
[im 76/76]
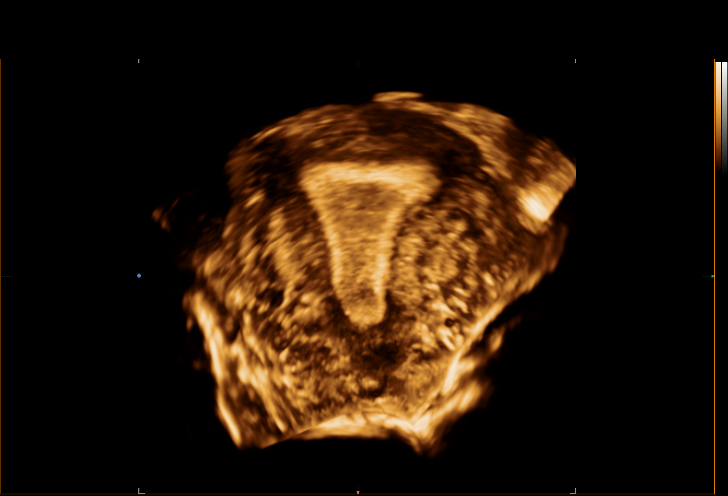

[15 of 25 positions shown; findings below may reference images not displayed]

FINDINGS: Uterus

Measurements: 8.1 x 4.8 x 6.4 cm. Calcified posterior right fibroid
measures up to 1.5 cm.

Endometrium

Thickness: 10 mm in thickness.  No focal abnormality visualized.

Right ovary

Measurements: 3.2 x 1.9 x 2.6 cm. Normal appearance/no adnexal mass.

Left ovary

Measurements: 2.9 x 2.0 x 2.0 cm. Normal appearance/no adnexal mass.

Other findings

Small amount of free fluid in the pelvis.
IMPRESSION: Small posterior calcified fibroid.  Otherwise unremarkable.

## 2019-08-09 ENCOUNTER — Other Ambulatory Visit: Payer: Self-pay

## 2019-08-09 ENCOUNTER — Encounter (HOSPITAL_COMMUNITY): Payer: Self-pay | Admitting: Emergency Medicine

## 2019-08-09 ENCOUNTER — Ambulatory Visit (HOSPITAL_COMMUNITY)
Admission: EM | Admit: 2019-08-09 | Discharge: 2019-08-09 | Disposition: A | Discharge status: Home / Self Care | Payer: Medicaid Other | Attending: Family Medicine | Admitting: Family Medicine

## 2019-08-09 DIAGNOSIS — L509 Urticaria, unspecified: Secondary | ICD-10-CM | POA: Diagnosis not present

## 2019-08-09 MED ORDER — METHYLPREDNISOLONE SODIUM SUCC 125 MG IJ SOLR
INTRAMUSCULAR | Status: AC
  Filled 2019-08-09: qty 2

## 2019-08-09 MED ORDER — METHYLPREDNISOLONE SODIUM SUCC 125 MG IJ SOLR
80.0000 mg | Freq: Once | INTRAMUSCULAR | Status: AC
  Administered 2019-08-09: 14:00:00 80 mg via INTRAMUSCULAR

## 2019-08-09 MED ORDER — CETIRIZINE HCL 10 MG PO TABS: 10.0000 mg | ORAL_TABLET | Freq: Two times a day (BID) | ORAL | 0 refills | Status: AC

## 2019-08-09 NOTE — ED Triage Notes (Signed)
Pt here for hives and itching x 1 month

## 2019-08-09 NOTE — Discharge Instructions (Addendum)
Take Zyrtec twice a day May use benadryl at bedtime if needed I've provided the allergy referral information if you need it

## 2019-08-09 NOTE — ED Provider Notes (Signed)
[facility]    CSN: [id number] Arrival date & time: 08/09/19  1201      History   Chief Complaint Chief Complaint  Patient presents with  . Allergic Reaction  . Urticaria    HPI Breanna Yates is a 34 y.o. female.   HPI  Pt presents today with a 1 month history of hives and full body itching. Never had a similar reaction before. This occurred the day after a tooth extraction. Benadryl helps, but only temporarily. No new medications, detergents, toiletries, or pets. No shortness of breath or wheezing.  Past Medical History:  Diagnosis Date  . Chlamydia 2008  . CIN III (cervical intraepithelial neoplasia grade III) with severe dysplasia   . Depression    mild per pt- overwhelmed and tired  . GDM (gestational diabetes mellitus) 11/26/2015  . History of gestational diabetes mellitus (GDM)   . Hypertension     Patient Active Problem List   Diagnosis Date Noted  . Encounter for sterilization     Past Surgical History:  Procedure Laterality Date  . CERVICAL CONIZATION W/BX N/A 09/18/2013   Procedure: CONIZATION CERVIX;  Surgeon: Frederico Hamman, MD;  Location: [facility];  Service: Gynecology;  Laterality: N/A;  . LAPAROSCOPIC TUBAL LIGATION N/A 03/01/2016   Procedure: LAPAROSCOPIC TUBAL LIGATION WITH FILSHIE CLIPS;  Surgeon: Osborne Oman, MD;  Location: [facility];  Service: Gynecology;  Laterality: N/A;  . TONGUE SURGERY    . WISDOM TOOTH EXTRACTION        OB History    Gravida  3   Para  3   Term  1   Preterm  2   AB  0   Living  4     SAB  0   TAB  0   Ectopic  0   Multiple  1   Live Births  4            Home Medications    Prior to Admission medications   Medication Sig Start Date End Date Taking? Authorizing Provider  BLISOVI 24 FE 1-20 MG-MCG(24) tablet Take 1 tablet by mouth daily. 01/14/16   [provider]  cetirizine (ZYRTEC) 10 MG tablet Take 1 tablet (10 mg total) by mouth 2 (two) times daily. 08/09/19   Raylene Everts, MD  escitalopram (LEXAPRO) 10 MG tablet TAKE 1 TABLET BY MOUTH TWICE A DAY 03/25/19   Shelly Bombard, MD  hydrochlorothiazide (HYDRODIURIL) 25 MG tablet TAKE 1 TABLET BY MOUTH EVERY DAY 02/11/19   Shelly Bombard, MD    Family History Family History  Problem Relation Age of Onset  . Hypertension Mother   . Asthma Sister   . Cancer Sister        brain tumor and seizures  . Hypertension Maternal Grandmother   . Diabetes Maternal Grandmother     Social History Social History   Tobacco Use  . Smoking status: Current Every Day Smoker    Packs/day: 0.50    Years: 13.00    Pack years: 6.50    Types: Cigarettes  . Smokeless tobacco: Never Used  Substance Use Topics  . Alcohol use: Yes    Comment: "Birthdays, Holidays"  . Drug use: No     Allergies   Pineapple   Review of Systems Review of Systems  HENT: Negative for trouble swallowing and voice change.   Respiratory: Negative for shortness of breath.   Skin: Positive for rash.     Physical  Exam Triage Vital Signs ED Triage Vitals [08/09/19 1231]  Enc Vitals Group     BP 128/85     Pulse Rate 83     Resp 18     Temp 98.5 F (36.9 C)     Temp Source Oral     SpO2 100 %     Weight      Height      Head Circumference      Peak Flow      Pain Score 3     Pain Loc      Pain Edu?      Excl. in [place]?    No data found.  Updated Vital Signs BP 128/85 (BP Location: Right Arm)   Pulse 83   Temp 98.5 F (36.9 C) (Oral)   Resp 18   SpO2 100%      Physical Exam Constitutional:      General: She is not in acute distress.    Appearance: She is well-developed. She is obese.     Comments: Uncomfortable with constant itching of skin  HENT:     Head: Normocephalic and atraumatic.     Mouth/Throat:     Mouth: Mucous membranes are moist.     Comments: No swelling of uvula Eyes:     Conjunctiva/sclera: Conjunctivae normal.     Pupils: Pupils are equal, round, and reactive to light.  Cardiovascular:      Rate and Rhythm: Normal rate.  Pulmonary:     Effort: Pulmonary effort is normal. No respiratory distress.     Breath sounds: Normal breath sounds. No wheezing.  Abdominal:     Comments: Protuberant abdomen  Musculoskeletal:        General: Normal range of motion.     Cervical back: Normal range of motion.  Skin:    General: Skin is warm and dry.     Findings: Rash present.     Comments: Urticarial wheals around abdomen and trunk.  Both arms.  Few on back.  Dermatographism is noted  Neurological:     Mental Status: She is alert. Mental status is at baseline.  Psychiatric:        Mood and Affect: Mood normal.        Behavior: Behavior normal.      UC Treatments / Results  Labs (all labs ordered are listed, but only abnormal results are displayed) Labs Reviewed - No data to display  EKG   Radiology No results found.  Procedures Procedures (including critical care time)  Medications Ordered in UC Medications  methylPREDNISolone sodium succinate (SOLU-MEDROL) 125 mg/2 mL injection 80 mg (80 mg Intramuscular Given 08/09/19 1348)    Initial Impression / Assessment and Plan / UC Course  I have reviewed the triage vital signs and the nursing notes.  Pertinent labs & imaging results that were available during my care of the patient were reviewed by me and considered in my medical decision making (see chart for details).     Reviewed difficulty in identifying what caused urticaria especially when it is chronic. Final Clinical Impressions(s) / UC Diagnoses   Final diagnoses:  Urticaria     Discharge Instructions     Take Zyrtec twice a day May use benadryl at bedtime if needed I've provided the allergy referral information if you need it    ED Prescriptions    Medication Sig Dispense Auth. Provider   cetirizine (ZYRTEC) 10 MG tablet Take 1 tablet (10 mg total) by mouth 2 (  two) times daily. 60 tablet Raylene Everts, MD     PDMP not reviewed this  encounter.   Raylene Everts, MD 08/09/19 2107

## 2019-10-03 ENCOUNTER — Other Ambulatory Visit: Payer: Self-pay

## 2019-10-03 ENCOUNTER — Emergency Department (HOSPITAL_COMMUNITY)
Admission: EM | Admit: 2019-10-03 | Discharge: 2019-10-03 | Discharge status: Against Medical Advice | Payer: Medicaid Other | Source: Home / Self Care

## 2019-10-04 ENCOUNTER — Inpatient Hospital Stay (HOSPITAL_COMMUNITY): Payer: Medicaid Other

## 2019-10-04 ENCOUNTER — Inpatient Hospital Stay (HOSPITAL_COMMUNITY)
Admission: AD | Admit: 2019-10-04 | Discharge: 2019-10-06 | DRG: 759 | Disposition: A | Discharge status: Home / Self Care | Payer: Medicaid Other | Attending: Obstetrics & Gynecology | Admitting: Obstetrics & Gynecology

## 2019-10-04 ENCOUNTER — Encounter (HOSPITAL_COMMUNITY): Payer: Self-pay | Admitting: Obstetrics & Gynecology

## 2019-10-04 ENCOUNTER — Other Ambulatory Visit: Payer: Self-pay | Admitting: Obstetrics & Gynecology

## 2019-10-04 DIAGNOSIS — R102 Pelvic and perineal pain: Secondary | ICD-10-CM | POA: Diagnosis not present

## 2019-10-04 DIAGNOSIS — Z8632 Personal history of gestational diabetes: Secondary | ICD-10-CM

## 2019-10-04 DIAGNOSIS — Z820 Family history of epilepsy and other diseases of the nervous system: Secondary | ICD-10-CM | POA: Diagnosis not present

## 2019-10-04 DIAGNOSIS — Z7251 High risk heterosexual behavior: Secondary | ICD-10-CM | POA: Diagnosis not present

## 2019-10-04 DIAGNOSIS — N83202 Unspecified ovarian cyst, left side: Secondary | ICD-10-CM | POA: Diagnosis not present

## 2019-10-04 DIAGNOSIS — A5424 Gonococcal female pelvic inflammatory disease: Secondary | ICD-10-CM | POA: Diagnosis present

## 2019-10-04 DIAGNOSIS — F17210 Nicotine dependence, cigarettes, uncomplicated: Secondary | ICD-10-CM | POA: Diagnosis present

## 2019-10-04 DIAGNOSIS — N730 Acute parametritis and pelvic cellulitis: Secondary | ICD-10-CM

## 2019-10-04 DIAGNOSIS — F329 Major depressive disorder, single episode, unspecified: Secondary | ICD-10-CM | POA: Diagnosis present

## 2019-10-04 DIAGNOSIS — Z808 Family history of malignant neoplasm of other organs or systems: Secondary | ICD-10-CM | POA: Diagnosis not present

## 2019-10-04 DIAGNOSIS — Z8249 Family history of ischemic heart disease and other diseases of the circulatory system: Secondary | ICD-10-CM | POA: Diagnosis not present

## 2019-10-04 DIAGNOSIS — A549 Gonococcal infection, unspecified: Secondary | ICD-10-CM

## 2019-10-04 DIAGNOSIS — I10 Essential (primary) hypertension: Secondary | ICD-10-CM | POA: Diagnosis present

## 2019-10-04 DIAGNOSIS — Z833 Family history of diabetes mellitus: Secondary | ICD-10-CM

## 2019-10-04 DIAGNOSIS — Z825 Family history of asthma and other chronic lower respiratory diseases: Secondary | ICD-10-CM | POA: Diagnosis not present

## 2019-10-04 DIAGNOSIS — Z8619 Personal history of other infectious and parasitic diseases: Secondary | ICD-10-CM | POA: Diagnosis not present

## 2019-10-04 DIAGNOSIS — Z79899 Other long term (current) drug therapy: Secondary | ICD-10-CM | POA: Diagnosis not present

## 2019-10-04 DIAGNOSIS — N739 Female pelvic inflammatory disease, unspecified: Secondary | ICD-10-CM | POA: Diagnosis not present

## 2019-10-04 DIAGNOSIS — Z86001 Personal history of in-situ neoplasm of cervix uteri: Secondary | ICD-10-CM

## 2019-10-04 DIAGNOSIS — Z20822 Contact with and (suspected) exposure to covid-19: Secondary | ICD-10-CM | POA: Diagnosis present

## 2019-10-04 DIAGNOSIS — R000 Tachycardia, unspecified: Secondary | ICD-10-CM | POA: Diagnosis not present

## 2019-10-04 DIAGNOSIS — D259 Leiomyoma of uterus, unspecified: Secondary | ICD-10-CM | POA: Diagnosis not present

## 2019-10-04 DIAGNOSIS — R109 Unspecified abdominal pain: Secondary | ICD-10-CM | POA: Diagnosis not present

## 2019-10-04 DIAGNOSIS — Z9851 Tubal ligation status: Secondary | ICD-10-CM

## 2019-10-04 HISTORY — DX: Acute parametritis and pelvic cellulitis: N73.0

## 2019-10-04 LAB — CBC WITH DIFFERENTIAL/PLATELET
Abs Immature Granulocytes: 0 10*3/uL (ref 0.00–0.07)
Basophils Absolute: 0 10*3/uL (ref 0.0–0.1)
Basophils Relative: 0 %
Eosinophils Absolute: 0 10*3/uL (ref 0.0–0.5)
Eosinophils Relative: 0 %
HCT: 38.3 % (ref 36.0–46.0)
Hemoglobin: 12.3 g/dL (ref 12.0–15.0)
Lymphocytes Relative: 1 %
Lymphs Abs: 0.4 10*3/uL — ABNORMAL LOW (ref 0.7–4.0)
MCH: 27.7 pg (ref 26.0–34.0)
MCHC: 32.1 g/dL (ref 30.0–36.0)
MCV: 86.3 fL (ref 80.0–100.0)
Monocytes Absolute: 0.7 10*3/uL (ref 0.1–1.0)
Monocytes Relative: 2 %
Neutro Abs: 36 10*3/uL — ABNORMAL HIGH (ref 1.7–7.7)
Neutrophils Relative %: 97 %
Platelets: 289 10*3/uL (ref 150–400)
RBC: 4.44 MIL/uL (ref 3.87–5.11)
RDW: 14.8 % (ref 11.5–15.5)
WBC: 37.1 10*3/uL — ABNORMAL HIGH (ref 4.0–10.5)
nRBC: 0 % (ref 0.0–0.2)
nRBC: 0 /100 WBC

## 2019-10-04 LAB — COMPREHENSIVE METABOLIC PANEL
ALT: 15 U/L (ref 0–44)
AST: 17 U/L (ref 15–41)
Albumin: 3.7 g/dL (ref 3.5–5.0)
Alkaline Phosphatase: 62 U/L (ref 38–126)
Anion gap: 13 (ref 5–15)
BUN: 7 mg/dL (ref 6–20)
CO2: 21 mmol/L — ABNORMAL LOW (ref 22–32)
Calcium: 9.4 mg/dL (ref 8.9–10.3)
Chloride: 101 mmol/L (ref 98–111)
Creatinine, Ser: 0.75 mg/dL (ref 0.44–1.00)
GFR calc Af Amer: >60 mL/min (ref >60)
GFR calc non Af Amer: >60 mL/min (ref >60)
Glucose, Bld: 141 mg/dL — ABNORMAL HIGH (ref 70–99)
Potassium: 3.5 mmol/L (ref 3.5–5.1)
Sodium: 135 mmol/L (ref 135–145)
Total Bilirubin: 0.6 mg/dL (ref 0.3–1.2)
Total Protein: 7.6 g/dL (ref 6.5–8.1)

## 2019-10-04 LAB — WET PREP, GENITAL
Sperm: NONE SEEN
Trich, Wet Prep: NONE SEEN
Yeast Wet Prep HPF POC: NONE SEEN

## 2019-10-04 LAB — URINALYSIS, ROUTINE W REFLEX MICROSCOPIC
Bilirubin Urine: NEGATIVE
Glucose, UA: NEGATIVE mg/dL
Hgb urine dipstick: NEGATIVE
Ketones, ur: NEGATIVE mg/dL
Leukocytes,Ua: NEGATIVE
Nitrite: NEGATIVE
Protein, ur: NEGATIVE mg/dL
Specific Gravity, Urine: 1.015 (ref 1.005–1.030)
pH: 6 (ref 5.0–8.0)

## 2019-10-04 LAB — POCT PREGNANCY, URINE: Preg Test, Ur: NEGATIVE

## 2019-10-04 LAB — LACTIC ACID, PLASMA
Lactic Acid, Venous: 2.3 mmol/L (ref 0.5–1.9)
Lactic Acid, Venous: 1.1 mmol/L (ref 0.5–1.9)

## 2019-10-04 LAB — I-STAT BETA HCG BLOOD, ED (MC, WL, AP ONLY): I-stat hCG, quantitative: <5 m[IU]/mL (ref <5)

## 2019-10-04 LAB — HIV ANTIBODY (ROUTINE TESTING W REFLEX): HIV Screen 4th Generation wRfx: NONREACTIVE

## 2019-10-04 LAB — LIPASE, BLOOD: Lipase: 15 U/L (ref 11–51)

## 2019-10-04 MED ORDER — IOHEXOL 300 MG/ML  SOLN
100.0000 mL | Freq: Once | INTRAMUSCULAR | Status: AC | PRN
  Administered 2019-10-04: 100 mL via INTRAVENOUS

## 2019-10-04 MED ORDER — ESCITALOPRAM OXALATE 10 MG PO TABS
10.0000 mg | ORAL_TABLET | Freq: Two times a day (BID) | ORAL | Status: DC
  Administered 2019-10-04 – 2019-10-06 (×4): 10 mg via ORAL
  Filled 2019-10-04 (×4): qty 1

## 2019-10-04 MED ORDER — SODIUM CHLORIDE 0.9 % IV SOLN
100.0000 mg | Freq: Two times a day (BID) | INTRAVENOUS | Status: DC
  Administered 2019-10-04 – 2019-10-05 (×2): 100 mg via INTRAVENOUS
  Filled 2019-10-04 (×4): qty 100

## 2019-10-04 MED ORDER — LACTATED RINGERS IV SOLN: INTRAVENOUS | Status: DC

## 2019-10-04 MED ORDER — ONDANSETRON HCL 4 MG/2ML IJ SOLN: 4.0000 mg | Freq: Four times a day (QID) | INTRAMUSCULAR | Status: DC | PRN

## 2019-10-04 MED ORDER — FENTANYL CITRATE (PF) 100 MCG/2ML IJ SOLN
50.0000 ug | Freq: Once | INTRAMUSCULAR | Status: AC
  Administered 2019-10-04: 13:00:00 50 ug via INTRAVENOUS
  Filled 2019-10-04: qty 2

## 2019-10-04 MED ORDER — PRENATAL MULTIVITAMIN CH
1.0000 | ORAL_TABLET | Freq: Every day | ORAL | Status: DC
  Filled 2019-10-04 (×2): qty 1

## 2019-10-04 MED ORDER — HYDROCODONE-ACETAMINOPHEN 5-325 MG PO TABS
1.0000 | ORAL_TABLET | ORAL | Status: DC | PRN
  Administered 2019-10-04 – 2019-10-05 (×2): 2 via ORAL
  Filled 2019-10-04 (×2): qty 2

## 2019-10-04 MED ORDER — SODIUM CHLORIDE 0.9 % IV SOLN
1000.0000 mL | INTRAVENOUS | Status: DC
  Administered 2019-10-04: 1000 mL via INTRAVENOUS

## 2019-10-04 MED ORDER — ONDANSETRON HCL 4 MG/2ML IJ SOLN
4.0000 mg | Freq: Once | INTRAMUSCULAR | Status: AC
  Administered 2019-10-04: 17:00:00 4 mg via INTRAVENOUS
  Filled 2019-10-04: qty 2

## 2019-10-04 MED ORDER — SODIUM CHLORIDE 0.9 % IV SOLN: 2.0000 g | Freq: Two times a day (BID) | INTRAVENOUS | Status: DC

## 2019-10-04 MED ORDER — SODIUM CHLORIDE 0.9 % IV SOLN
2.0000 g | Freq: Four times a day (QID) | INTRAVENOUS | Status: DC
  Administered 2019-10-04 – 2019-10-05 (×3): 2 g via INTRAVENOUS
  Filled 2019-10-04 (×6): qty 2

## 2019-10-04 MED ORDER — ONDANSETRON HCL 4 MG PO TABS
4.0000 mg | ORAL_TABLET | Freq: Four times a day (QID) | ORAL | Status: DC | PRN
  Administered 2019-10-04: 4 mg via ORAL
  Filled 2019-10-04: qty 1

## 2019-10-04 MED ORDER — HYDROCHLOROTHIAZIDE 25 MG PO TABS
25.0000 mg | ORAL_TABLET | Freq: Every day | ORAL | Status: DC
  Administered 2019-10-05 – 2019-10-06 (×2): 25 mg via ORAL
  Filled 2019-10-04 (×3): qty 1

## 2019-10-04 MED ORDER — MORPHINE SULFATE (PF) 4 MG/ML IV SOLN
4.0000 mg | Freq: Once | INTRAVENOUS | Status: AC
  Administered 2019-10-04: 17:00:00 4 mg via INTRAVENOUS
  Filled 2019-10-04: qty 1

## 2019-10-04 MED ORDER — SODIUM CHLORIDE 0.9 % IV BOLUS
1000.0000 mL | Freq: Once | INTRAVENOUS | Status: AC
  Administered 2019-10-04: 1000 mL via INTRAVENOUS

## 2019-10-04 MED ORDER — SODIUM CHLORIDE 0.9 % IV BOLUS (SEPSIS)
1000.0000 mL | Freq: Once | INTRAVENOUS | Status: AC
  Administered 2019-10-04: 1000 mL via INTRAVENOUS

## 2019-10-04 MED ORDER — SODIUM CHLORIDE 0.9 % IV SOLN: 100.0000 mg | Freq: Two times a day (BID) | INTRAVENOUS | Status: DC

## 2019-10-04 MED ORDER — IBUPROFEN 800 MG PO TABS
800.0000 mg | ORAL_TABLET | Freq: Three times a day (TID) | ORAL | Status: DC | PRN
  Administered 2019-10-05: 800 mg via ORAL
  Filled 2019-10-04: qty 1

## 2019-10-04 NOTE — ED Provider Notes (Signed)
Care assumed from Breanna Lull, Yates at shift change. See her note for full HPI.  In short, Patient is a 34 year old female with a past medical history significant for STDs who presents to the ED from Breanna Yates due to worsening lower abdominal pain which is gradually become generalized over the past 3 days associated with nausea and nonbloody, nonbilious emesis which became unbearable on Wednesday and Thursday.  She has a history of tubal ligation, but no other abdominal operations.  Last bowel movement yesterday which was significant for hard stools in which she took a laxative with no relief.  She is currently sexually active with one partner without protection. Patient is a G20P4 female.   Plan from previous provider: Pending CT abdomen/pelvis and pelvic US. Most likely will need admission for pain control and findings on CT/US  Previous provider concerned about PID given pelvic exam. Physical Exam  BP (!) 144/88   Pulse (!) 111   Temp (!) 97.4 F (36.3 C) (Oral)   Resp (!) 23   Ht 5\' 7"  (1.702 m)   Wt 99.8 kg   SpO2 100%   BMI 34.46 kg/m   Physical Exam Vitals and nursing note reviewed.  Constitutional:      General: She is not in acute distress.    Appearance: She is not toxic-appearing.     Comments: Appears to be in significant amount of pain in bed.   HENT:     Head: Normocephalic.  Eyes:     Pupils: Pupils are equal, round, and reactive to light.  Cardiovascular:     Rate and Rhythm: Normal rate and regular rhythm.     Pulses: Normal pulses.     Heart sounds: Normal heart sounds. No murmur. No friction rub. No gallop.   Pulmonary:     Effort: Pulmonary effort is normal.     Breath sounds: Normal breath sounds.  Abdominal:     General: Abdomen is flat. Bowel sounds are normal. There is no distension.     Palpations: Abdomen is soft.     Tenderness: There is abdominal tenderness. There is no guarding or rebound.     Comments: Diffuse abdominal tenderness. No rebound or  guarding.   Genitourinary:    Comments: Pelvic exam performed by previous provider. See her note for comments.  Musculoskeletal:     Cervical back: Neck supple.     Comments: Able to move all 4 extremities without difficulty.   Skin:    General: Skin is warm and dry.  Neurological:     General: No focal deficit present.     Mental Status: She is alert.     ED Course/Procedures   Clinical Course as of Oct 03 2020  Fri Oct 04, 2019  1415 Reexamined patient, she states her pain has improved slightly with the pain medicine.  She refers now that she thinks that there might be an old tampon in her vagina.  I did not appreciate a tampon on bimanual physical exam.  Pelvic exam showed diffuse greenish discharge, bimanual exam showed cervical motion tenderness and severe bilateral adnexal tenderness   [MB]  1424 WBC is 37.1, lactate elevated 2.3.  Given pelvic physical exam findings, ordered pelvic ultrasound for possible abscess, PID   [MB]  1551 WBC(!): 37.1 [CA]  1552 Lactic Acid, Venous(!!): 2.3 [CA]  1552 Clue Cells Wet Prep HPF POC(!): PRESENT [CA]  1721 Temp: 100.3 F (37.9 C) [CA]  1721 Given patient's newest temperature. Will obtain rectal temperature once  patient return from CT scan. Blood cultures and IV antibiotics started for PID.   Pulse Rate(!): 111 [CA]  2010 Temp(!): 100.4 F (38 C) [CA]  2013 Spoke to Breanna Yates who agrees to admit patient for further treatment of PID.   [CA]    Clinical Course User Index [CA] Breanna Yates [MB] Breanna Yates    Procedures  MDM  Care assumed from Breanna Yates, [place]. See her note for full MDM.  34 year old female presents the ED due to worsening lower quadrant abdominal pain x3 days. Upon arrival, patient afebrile and tachycardic at 113, but otherwise reassuring vitals.  Throughout patient's ED stay patient became febrile at 100.4 with consistent tachycardia. IVF already given by previous provider. Started patient  on IV antibiotics once patient became febrile for PID. Patient in no acute distress and non-toxic appearing. Tenderness to palpation in bilateral lower quadrants. Previous provider performed pelvic exam which was concerning for PID. Patient admits to having numerous partners without protection with a history of chlamydia.   UA negative for signs of infection.  Wet prep positive for clue cells and many white blood cells.  Beta hCG normal.  BC significant for leukocytosis at 37.1.  Lipase normal at 15.  Doubt pancreatitis. CMP reassuring with normal renal function and no severe electrolyte derangements.  EGD personally reviewed which demonstrates sinus tachycardia with no signs of acute ischemia.  [place] personally reviewed which demonstrates: IMPRESSION:  1. 1.1 cm calcified right posterior fibroid again noted.    2. Exam otherwise unremarkable.    CT abdomen personally reviewed which demonstrates: IMPRESSION:  Borderline hepatomegaly.    A left ovarian 3 cm benign functional cyst with trace free fluid in  the rectouterine pouch that is likely physiologic. No additional  imaging characterization or surveillance is recommended unless  clinically indicated.    Questionable mild diffuse mucosal thickening of the pelvic small  bowel loops which could be inflammatory or infectious. It is also  possible this appearance is due to incomplete distension of these  loops by enteric contrast material.   Suspect patient's symptoms are related to PID.  Patient already given IV fluids and IV antibiotics.  Patient remains febrile and tachycardic throughout entire ED stay.  Discussed case with Breanna Yates with OB/GYN who agrees to admit patient for further treatment of PID.  Covid test pending.     Breanna Yates 10/04/19 2029    Breanna Gambler, MD 10/06/19 [phone]

## 2019-10-04 NOTE — MAU Provider Note (Signed)
  S: 34 y.o. [id number] female presents to MAU for severe abdominal pain, starting 3 days ago, but worsening today. She is s/p BTL for contraception.  She believes the pain started in her uterus but is now higher in her abdomen, above her umbilicus and into the epigastric area.  She describes the pain as constant, generalized in her whole abdomen, sharp pain that is progressively worsening.  She denies constipation but reports she took a laxative yesterday to try to improve the pain but it did not help.  O: BP (!) 144/88   Pulse (!) 111   Temp (!) 97.4 F (36.3 C) (Oral)   Resp (!) 23   Ht 5\' 7"  (1.702 m)   Wt 99.8 kg   SpO2 100%   BMI 34.46 kg/m    VS reviewed, nursing note reviewed,  Constitutional: well developed, well nourished, moderate distress, pt diaphoretic, pale upon arrival in MAU HEENT: normocephalic CV: mildly tachycardic Pulm/chest wall: normal effort Abdomen: soft Neuro: alert and oriented x 3 Skin: warm, dry Psych: affect normal  A: Generalized abdominal pain Negative pregnancy test Medical screening exam performed by CNM  P: Transfer to ED for further evaluation Pt transported via stretcher by MAU staff  Fatima Blank, CNM 3:08 PM

## 2019-10-04 NOTE — MAU Note (Signed)
Pt presents to MAU with c/o abdominal pain started two days ago. Pt reports no bleeding. Pt pregnancy test came back negative in MAU. She is reporting nausea and vomiting. LMP: 09/26/2019.

## 2019-10-04 NOTE — ED Provider Notes (Cosign Needed)
[facility] EMERGENCY DEPARTMENT Provider Note   CSN: [id number] Arrival date & time: 10/04/19  1144     History Chief Complaint  Patient presents with  . Abdominal Pain    Breanna Yates is a 34 y.o. female.  HPI 34 year old female with a STD history presents to the ED from MAU with a 3-day history of worsening lower abdominal pain that is slowly been traveling upwards and nausea/vomiting.  She states that her period finished on Monday, she started developing lower abdominal pain on Tuesday.  She states the pain continued to increase in was unbearable Wednesday and Thursday.  She went to [facility] where they did a pregnancy test which was negative.  She was then sent over to our ER.  She has had a tubal ligation but no other abdominal surgeries.  She states her last bowel movement was yesterday, stool was hard so she took a laxative but that did not seem to help.  Endorses chills and sweats, difficulty voiding urine and bowels.  The  pain has gradually traveled upwards and is now under her rib cage and diffusely throughout her abdomen.  She is not able to get comfortable or lay on her sides.  She is sexually active with a history of unprotected sex 1 week ago.  Denies diarrhea, vaginal pain/discharge/odor.    Past Medical History:  Diagnosis Date  . Chlamydia 2008  . CIN III (cervical intraepithelial neoplasia grade III) with severe dysplasia   . Depression    mild per pt- overwhelmed and tired  . GDM (gestational diabetes mellitus) 11/26/2015  . History of gestational diabetes mellitus (GDM)   . Hypertension     Patient Active Problem List   Diagnosis Date Noted  . Encounter for sterilization     Past Surgical History:  Procedure Laterality Date  . CERVICAL CONIZATION W/BX N/A 09/18/2013   Procedure: CONIZATION CERVIX;  Surgeon: Frederico Hamman, MD;  Location: [facility];  Service: Gynecology;  Laterality: N/A;  . LAPAROSCOPIC TUBAL LIGATION N/A 03/01/2016   Procedure: LAPAROSCOPIC TUBAL LIGATION WITH FILSHIE CLIPS;  Surgeon: Osborne Oman, MD;  Location: [facility];  Service: Gynecology;  Laterality: N/A;  . TONGUE SURGERY    . WISDOM TOOTH EXTRACTION       OB History    Gravida  3   Para  3   Term  1   Preterm  2   AB  0   Living  4     SAB  0   TAB  0   Ectopic  0   Multiple  1   Live Births  4           Family History  Problem Relation Age of Onset  . Hypertension Mother   . Asthma Sister   . Cancer Sister        brain tumor and seizures  . Hypertension Maternal Grandmother   . Diabetes Maternal Grandmother     Social History   Tobacco Use  . Smoking status: Current Every Day Smoker    Packs/day: 0.50    Years: 13.00    Pack years: 6.50    Types: Cigarettes  . Smokeless tobacco: Never Used  Substance Use Topics  . Alcohol use: Yes    Comment: "Birthdays, Holidays"  . Drug use: No    Home Medications Prior to Admission medications   Medication Sig Start Date End Date Taking? Authorizing Provider  cetirizine (ZYRTEC) 10 MG tablet Take 1 tablet (  10 mg total) by mouth 2 (two) times daily. 08/09/19  Yes Raylene Everts, MD  escitalopram (LEXAPRO) 10 MG tablet TAKE 1 TABLET BY MOUTH TWICE A DAY Patient taking differently: Take 10 mg by mouth in the morning and at bedtime.  03/25/19  Yes Shelly Bombard, MD  hydrochlorothiazide (HYDRODIURIL) 25 MG tablet TAKE 1 TABLET BY MOUTH EVERY DAY Patient taking differently: Take 25 mg by mouth daily.  02/11/19  Yes Shelly Bombard, MD    Allergies    Pineapple  Review of Systems   Review of Systems  Constitutional: Positive for activity change, appetite change, chills, diaphoresis, fatigue and fever.  HENT: Negative for ear pain and sore throat.   Eyes: Negative for pain and visual disturbance.  Respiratory: Negative for cough and shortness of breath.   Cardiovascular: Negative for chest pain and palpitations.  Gastrointestinal: Positive for abdominal  distention, abdominal pain, constipation, nausea and vomiting. Negative for diarrhea.  Genitourinary: Positive for difficulty urinating and pelvic pain. Negative for dysuria, frequency, genital sores, hematuria, menstrual problem, vaginal bleeding, vaginal discharge and vaginal pain.  Musculoskeletal: Negative for arthralgias, back pain and myalgias.  Skin: Negative for color change and rash.  Neurological: Positive for dizziness and weakness. Negative for seizures, syncope and headaches.  Psychiatric/Behavioral: Negative for confusion.  All other systems reviewed and are negative.   Physical Exam Updated Vital Signs BP 121/68   Pulse (!) 109   Temp 100.3 F (37.9 C) (Oral)   Resp (!) 28   Ht 5\' 7"  (1.702 m)   Wt 99.8 kg   SpO2 100%   BMI 34.46 kg/m   Physical Exam Constitutional:      General: She is in acute distress.     Appearance: Normal appearance. She is well-developed.  HENT:     Head: Normocephalic and atraumatic.  Eyes:     Extraocular Movements: Extraocular movements intact.     Conjunctiva/sclera: Conjunctivae normal.     Pupils: Pupils are equal, round, and reactive to light.  Cardiovascular:     Rate and Rhythm: Normal rate and regular rhythm.     Pulses: Normal pulses.     Heart sounds: Normal heart sounds.  Pulmonary:     Effort: Pulmonary effort is normal.     Breath sounds: Normal breath sounds.  Abdominal:     General: Bowel sounds are normal. There is distension.     Palpations: There is no mass.     Tenderness: There is generalized abdominal tenderness. There is guarding and rebound. There is no right CVA tenderness or left CVA tenderness. Negative signs include Murphy's sign and McBurney's sign.  Musculoskeletal:        General: Normal range of motion.     Cervical back: Normal range of motion. No tenderness.  Skin:    General: Skin is warm and dry.  Neurological:     General: No focal deficit present.     Mental Status: She is alert and  oriented to person, place, and time.  Psychiatric:        Mood and Affect: Mood normal.        Behavior: Behavior normal.     ED Results / Procedures / Treatments   Labs (all labs ordered are listed, but only abnormal results are displayed) Labs Reviewed  WET PREP, GENITAL - Abnormal; Notable for the following components:      Result Value   Clue Cells Wet Prep HPF POC PRESENT (*)    WBC,  Wet Prep HPF POC MANY (*)    All other components within normal limits  COMPREHENSIVE METABOLIC PANEL - Abnormal; Notable for the following components:   CO2 21 (*)    Glucose, Bld 141 (*)    All other components within normal limits  CBC WITH DIFFERENTIAL/PLATELET - Abnormal; Notable for the following components:   WBC 37.1 (*)    Neutro Abs 36.0 (*)    Lymphs Abs 0.4 (*)    All other components within normal limits  LACTIC ACID, PLASMA - Abnormal; Notable for the following components:   Lactic Acid, Venous 2.3 (*)    All other components within normal limits  LIPASE, BLOOD  LACTIC ACID, PLASMA  URINALYSIS, ROUTINE W REFLEX MICROSCOPIC  HIV ANTIBODY (ROUTINE TESTING W REFLEX)  RPR  POCT PREGNANCY, URINE  I-STAT BETA HCG BLOOD, ED ([facility], [facility], [facility] ONLY)  GC/CHLAMYDIA PROBE AMP ([place]) NOT AT [facility]    EKG EKG Interpretation  Date/Time:  Friday October 04 2019 13:16:03 EDT Ventricular Rate:  111 PR Interval:    QRS Duration: 87 QT Interval:  314 QTC Calculation: 427 R Axis:   67 Text Interpretation: Sinus tachycardia when comapred to prior, faster rate. No sTEMI Confirmed by Antony Blackbird [phone]) on 10/04/2019 2:25:57 PM   Radiology No results found.  Procedures Procedures (including critical care time)  Medications Ordered in ED Medications  sodium chloride 0.9 % bolus 1,000 mL (0 mLs Intravenous Stopped 10/04/19 1548)    Followed by  0.9 %  sodium chloride infusion (has no administration in time range)  ondansetron (ZOFRAN) injection 4 mg (has no administration in time  range)  fentaNYL (SUBLIMAZE) injection 50 mcg (50 mcg Intravenous Given 10/04/19 1328)  sodium chloride 0.9 % bolus 1,000 mL (1,000 mLs Intravenous New Bag/Given 10/04/19 1634)    ED Course  I have reviewed the triage vital signs and the nursing notes.  Pertinent labs & imaging results that were available during my care of the patient were reviewed by me and considered in my medical decision making (see chart for details).  Clinical Course as of Oct 03 1633  Fri Oct 04, 2019  1415 Reexamined patient, she states her pain has improved slightly with the pain medicine.  She refers now that she thinks that there might be an old tampon in her vagina.  I did not appreciate a tampon on bimanual physical exam.  Pelvic exam showed diffuse greenish discharge, bimanual exam showed cervical motion tenderness and severe bilateral adnexal tenderness   [MB]  1424 WBC is 37.1, lactate elevated 2.3.  Given pelvic physical exam findings, ordered pelvic ultrasound for possible abscess, PID   [MB]  1551 WBC(!): 37.1 [CA]  1552 Lactic Acid, Venous(!!): 2.3 [CA]  1552 Clue Cells Wet Prep HPF POC(!): PRESENT [CA]    Clinical Course User Index [CA] Suzy Bouchard, PA-C [MB] Lyndel Safe   MDM Rules/Calculators/A&P                      34 year-old African-American female, [phone], with a history of chlamydia and CIN-3 with severe dysplasia, presents to the ER from MAU with abdominal pain, nausea, vomiting. -DDX: appendicitis, SBO/LBO, PID, tubo-ovarian abscess, ovarian torsion, diverticulitis,mesenteric ischemia/ischemic bowel, peritonitis   -Hypertensive, tachycardic, mildly tachypneic.  Temp 97.4 at admission. -Pelvic exam showed profuse greenish discharge, mildly erythematous cervix, severe cervical motion and bilateral adnexal tenderness. -Marked leukocytosis 37.1, elevated lactate of 2.3.  Unclear of source at this time.  Urine pregnancy negative.  Wet prep showed clue cells and many  WBCs. -Pelvic ultrasound and CT of abdomen pending.  EKG shows sinus tach. -Fentanyl for pain, patient noted only a mild decrease in her abdominal pain -Anticipate need for admission given extremely elevated WBC, elevated lactate severe diffuse abdominal pain -Signed out patient to Charmaine Downs PA-C Final Clinical Impression(s) / ED Diagnoses Final diagnoses:  Pelvic pain    Rx / DC Orders ED Discharge Orders    None       Garald Balding, PA-C 10/04/19 1635

## 2019-10-04 NOTE — MAU Note (Signed)
ED charge nurse phone called with no answer, called main ED number and spoke with Alexa and she transferred me and no answer.

## 2019-10-04 NOTE — ED Notes (Signed)
Pt transported to CT ?

## 2019-10-04 NOTE — MAU Note (Signed)
Spoke with Janett Billow RN charge nurse and given report on pt.

## 2019-10-04 NOTE — ED Triage Notes (Signed)
Pt sent from MAU for further evaluation generalized abd pain x 3 days; states it started in the lower abdomen and moved upwards; last BM yesterday, took laxitive; endorses n/v, denies diarrhea; endorses pain and difficulty voiding urine/bowels; hx tubal ligation 3 years ago; endorses chills and sweats

## 2019-10-05 ENCOUNTER — Encounter (HOSPITAL_COMMUNITY): Payer: Self-pay | Admitting: Obstetrics & Gynecology

## 2019-10-05 DIAGNOSIS — A549 Gonococcal infection, unspecified: Secondary | ICD-10-CM

## 2019-10-05 HISTORY — DX: Gonococcal infection, unspecified: A54.9

## 2019-10-05 LAB — CBC
HCT: 34.9 % — ABNORMAL LOW (ref 36.0–46.0)
Hemoglobin: 11.3 g/dL — ABNORMAL LOW (ref 12.0–15.0)
MCH: 27.6 pg (ref 26.0–34.0)
MCHC: 32.4 g/dL (ref 30.0–36.0)
MCV: 85.3 fL (ref 80.0–100.0)
Platelets: 273 10*3/uL (ref 150–400)
RBC: 4.09 MIL/uL (ref 3.87–5.11)
RDW: 14.9 % (ref 11.5–15.5)
WBC: 35.9 10*3/uL — ABNORMAL HIGH (ref 4.0–10.5)
nRBC: 0 % (ref 0.0–0.2)

## 2019-10-05 LAB — HEPATITIS C ANTIBODY: HCV Ab: NONREACTIVE

## 2019-10-05 LAB — GC/CHLAMYDIA PROBE AMP (~~LOC~~) NOT AT ARMC
Chlamydia: NEGATIVE
Neisseria Gonorrhea: POSITIVE — AB

## 2019-10-05 LAB — HEPATITIS B SURFACE ANTIGEN: Hepatitis B Surface Ag: NONREACTIVE

## 2019-10-05 LAB — RPR: RPR Ser Ql: NONREACTIVE

## 2019-10-05 LAB — SARS CORONAVIRUS 2 (TAT 6-24 HRS): SARS Coronavirus 2: NEGATIVE

## 2019-10-05 MED ORDER — SODIUM CHLORIDE 0.9 % IV SOLN: 2.0000 g | Freq: Two times a day (BID) | INTRAVENOUS | Status: DC

## 2019-10-05 MED ORDER — METRONIDAZOLE 500 MG PO TABS
500.0000 mg | ORAL_TABLET | Freq: Two times a day (BID) | ORAL | Status: DC
  Administered 2019-10-05 – 2019-10-06 (×3): 500 mg via ORAL
  Filled 2019-10-05 (×3): qty 1

## 2019-10-05 MED ORDER — HYDROCODONE-ACETAMINOPHEN 5-325 MG PO TABS
1.0000 | ORAL_TABLET | ORAL | Status: DC | PRN
  Administered 2019-10-05: 1 via ORAL
  Filled 2019-10-05: qty 1

## 2019-10-05 MED ORDER — DIPHENHYDRAMINE HCL 50 MG/ML IJ SOLN
25.0000 mg | Freq: Four times a day (QID) | INTRAMUSCULAR | Status: DC | PRN
  Administered 2019-10-05: 25 mg via INTRAVENOUS
  Filled 2019-10-05 (×2): qty 1

## 2019-10-05 MED ORDER — CEFTRIAXONE SODIUM 500 MG IJ SOLR
500.0000 mg | Freq: Once | INTRAMUSCULAR | Status: AC
  Administered 2019-10-05: 500 mg via INTRAMUSCULAR
  Filled 2019-10-05: qty 500

## 2019-10-05 MED ORDER — LORATADINE 10 MG PO TABS
10.0000 mg | ORAL_TABLET | Freq: Two times a day (BID) | ORAL | Status: DC
  Administered 2019-10-05 – 2019-10-06 (×2): 10 mg via ORAL
  Filled 2019-10-05 (×5): qty 1

## 2019-10-05 MED ORDER — DOXYCYCLINE HYCLATE 100 MG PO TABS
100.0000 mg | ORAL_TABLET | Freq: Two times a day (BID) | ORAL | Status: DC
  Administered 2019-10-05 – 2019-10-06 (×3): 100 mg via ORAL
  Filled 2019-10-05 (×3): qty 1

## 2019-10-05 MED ORDER — SODIUM CHLORIDE 0.9 % IV SOLN
2.0000 g | Freq: Two times a day (BID) | INTRAVENOUS | Status: DC
  Administered 2019-10-05: 2 g via INTRAVENOUS
  Filled 2019-10-05 (×2): qty 2

## 2019-10-05 MED ORDER — DIPHENHYDRAMINE HCL 25 MG PO CAPS
25.0000 mg | ORAL_CAPSULE | Freq: Four times a day (QID) | ORAL | Status: DC | PRN
  Administered 2019-10-05: 25 mg via ORAL
  Filled 2019-10-05: qty 1

## 2019-10-05 MED ORDER — LORATADINE 10 MG PO TABS: 10.0000 mg | ORAL_TABLET | Freq: Two times a day (BID) | ORAL | Status: DC

## 2019-10-05 NOTE — Progress Notes (Signed)
NT awoke pt to complete vital signs Pt then states she is having abdominal pain  Pt refusing heating pack and oral PRN medications  Pt states "I want it to go through my IV" Educated pt on plan of care  Informed MD, MD aware No new orders Will continue to monitor

## 2019-10-05 NOTE — H&P (Signed)
Breanna Yates is an 34 y.o. P4 here for a 3 day h/o pelvic and abdominal pain with nausea and vomiting. She had chlamydia a long time ago and does have unprotected sex. She has had a BTL in the distant past. She recently finished her period. Her WBC was 37 with a low grade fever. Her CT and gyn u/s were essentially normal.    No LMP recorded.    Past Medical History:  Diagnosis Date  . Chlamydia 2008  . CIN III (cervical intraepithelial neoplasia grade III) with severe dysplasia   . Depression    mild per pt- overwhelmed and tired  . GDM (gestational diabetes mellitus) 11/26/2015  . History of gestational diabetes mellitus (GDM)   . Hypertension     Past Surgical History:  Procedure Laterality Date  . CERVICAL CONIZATION W/BX N/A 09/18/2013   Procedure: CONIZATION CERVIX;  Surgeon: Frederico Hamman, MD;  Location: [facility];  Service: Gynecology;  Laterality: N/A;  . LAPAROSCOPIC TUBAL LIGATION N/A 03/01/2016   Procedure: LAPAROSCOPIC TUBAL LIGATION WITH FILSHIE CLIPS;  Surgeon: Osborne Oman, MD;  Location: [facility];  Service: Gynecology;  Laterality: N/A;  . TONGUE SURGERY    . WISDOM TOOTH EXTRACTION      Family History  Problem Relation Age of Onset  . Hypertension Mother   . Asthma Sister   . Cancer Sister        brain tumor and seizures  . Hypertension Maternal Grandmother   . Diabetes Maternal Grandmother     Social History:  reports that she has been smoking cigarettes. She has a 6.50 pack-year smoking history. She has never used smokeless tobacco. She reports current alcohol use. She reports that she does not use drugs.  Allergies:  Allergies  Allergen Reactions  . Pineapple Itching    Oral itching, no swelling    Medications Prior to Admission  Medication Sig Dispense Refill Last Dose  . cetirizine (ZYRTEC) 10 MG tablet Take 1 tablet (10 mg total) by mouth 2 (two) times daily. 60 tablet 0 10/03/2019 at Unknown time  . escitalopram (LEXAPRO) 10 MG tablet TAKE 1  TABLET BY MOUTH TWICE A DAY (Patient taking differently: Take 10 mg by mouth in the morning and at bedtime. ) 60 tablet 11 10/03/2019 at Unknown time  . hydrochlorothiazide (HYDRODIURIL) 25 MG tablet TAKE 1 TABLET BY MOUTH EVERY DAY (Patient taking differently: Take 25 mg by mouth daily. ) 30 tablet 11 10/03/2019 at Unknown time    Review of Systems She has had all normal pap smears since her [facility].  Blood pressure (!) 135/92, pulse 85, temperature 98.3 F (36.8 C), temperature source Oral, resp. rate 16, height 5\' 7"  (1.702 m), weight 99.8 kg, SpO2 100 %. Physical Exam  Breathing, conversing, and ambulating normally Well nourished, well hydrated Black female, no apparent distress Heart- rrr Lungs- CTAB Abd- somewhat tender with deep palpation  Results for orders placed or performed during the hospital encounter of 10/04/19 (from the past 24 hour(s))  Pregnancy, urine POC     Status: None   Collection Time: 10/04/19 11:56 AM  Result Value Ref Range   Preg Test, Ur NEGATIVE NEGATIVE  Comprehensive metabolic panel     Status: Abnormal   Collection Time: 10/04/19  1:35 PM  Result Value Ref Range   Sodium 135 135 - 145 mmol/L   Potassium 3.5 3.5 - 5.1 mmol/L   Chloride 101 98 - 111 mmol/L   CO2 21 (L) 22 - 32  mmol/L   Glucose, Bld 141 (H) 70 - 99 mg/dL   BUN 7 6 - 20 mg/dL   Creatinine, Ser 0.75 0.44 - 1.00 mg/dL   Calcium 9.4 8.9 - 10.3 mg/dL   Total Protein 7.6 6.5 - 8.1 g/dL   Albumin 3.7 3.5 - 5.0 g/dL   AST 17 15 - 41 U/L   ALT 15 0 - 44 U/L   Alkaline Phosphatase 62 38 - 126 U/L   Total Bilirubin 0.6 0.3 - 1.2 mg/dL   GFR calc non Af Amer >60 >60 mL/min   GFR calc Af Amer >60 >60 mL/min   Anion gap 13 5 - 15  Lipase, blood     Status: None   Collection Time: 10/04/19  1:35 PM  Result Value Ref Range   Lipase 15 11 - 51 U/L  CBC with Diff     Status: Abnormal   Collection Time: 10/04/19  1:35 PM  Result Value Ref Range   WBC 37.1 (H) 4.0 - 10.5 K/uL   RBC 4.44 3.87 -  5.11 MIL/uL   Hemoglobin 12.3 12.0 - 15.0 g/dL   HCT 38.3 36.0 - 46.0 %   MCV 86.3 80.0 - 100.0 fL   MCH 27.7 26.0 - 34.0 pg   MCHC 32.1 30.0 - 36.0 g/dL   RDW 14.8 11.5 - 15.5 %   Platelets 289 150 - 400 K/uL   nRBC 0.0 0.0 - 0.2 %   Neutrophils Relative % 97 %   Neutro Abs 36.0 (H) 1.7 - 7.7 K/uL   Lymphocytes Relative 1 %   Lymphs Abs 0.4 (L) 0.7 - 4.0 K/uL   Monocytes Relative 2 %   Monocytes Absolute 0.7 0.1 - 1.0 K/uL   Eosinophils Relative 0 %   Eosinophils Absolute 0.0 0.0 - 0.5 K/uL   Basophils Relative 0 %   Basophils Absolute 0.0 0.0 - 0.1 K/uL   nRBC 0 0 /100 WBC   Abs Immature Granulocytes 0.00 0.00 - 0.07 K/uL  I-Stat beta hCG blood, ED     Status: None   Collection Time: 10/04/19  1:37 PM  Result Value Ref Range   I-stat hCG, quantitative <5.0 <5 mIU/mL   Comment 3          Lactic acid, plasma     Status: Abnormal   Collection Time: 10/04/19  1:45 PM  Result Value Ref Range   Lactic Acid, Venous 2.3 (HH) 0.5 - 1.9 mmol/L  Wet prep, genital     Status: Abnormal   Collection Time: 10/04/19  2:20 PM   Specimen: PATH Cytology Cervicovaginal Ancillary Only  Result Value Ref Range   Yeast Wet Prep HPF POC NONE SEEN NONE SEEN   Trich, Wet Prep NONE SEEN NONE SEEN   Clue Cells Wet Prep HPF POC PRESENT (A) NONE SEEN   WBC, Wet Prep HPF POC MANY (A) NONE SEEN   Sperm NONE SEEN   Lactic acid, plasma     Status: None   Collection Time: 10/04/19  3:26 PM  Result Value Ref Range   Lactic Acid, Venous 1.1 0.5 - 1.9 mmol/L  HIV Antibody (routine testing w rflx)     Status: None   Collection Time: 10/04/19  3:26 PM  Result Value Ref Range   HIV Screen 4th Generation wRfx NON REACTIVE NON REACTIVE  Urinalysis, Routine w reflex microscopic     Status: Abnormal   Collection Time: 10/04/19  5:33 PM  Result Value Ref Range  Color, Urine STRAW (A) YELLOW   APPearance CLEAR CLEAR   Specific Gravity, Urine 1.015 1.005 - 1.030   pH 6.0 5.0 - 8.0   Glucose, UA NEGATIVE  NEGATIVE mg/dL   Hgb urine dipstick NEGATIVE NEGATIVE   Bilirubin Urine NEGATIVE NEGATIVE   Ketones, ur NEGATIVE NEGATIVE mg/dL   Protein, ur NEGATIVE NEGATIVE mg/dL   Nitrite NEGATIVE NEGATIVE   Leukocytes,Ua NEGATIVE NEGATIVE  SARS CORONAVIRUS 2 (TAT 6-24 HRS) Nasopharyngeal Nasopharyngeal Swab     Status: None   Collection Time: 10/04/19  8:22 PM   Specimen: Nasopharyngeal Swab  Result Value Ref Range   SARS Coronavirus 2 NEGATIVE NEGATIVE  CBC     Status: Abnormal   Collection Time: 10/05/19  2:44 AM  Result Value Ref Range   WBC 35.9 (H) 4.0 - 10.5 K/uL   RBC 4.09 3.87 - 5.11 MIL/uL   Hemoglobin 11.3 (L) 12.0 - 15.0 g/dL   HCT 34.9 (L) 36.0 - 46.0 %   MCV 85.3 80.0 - 100.0 fL   MCH 27.6 26.0 - 34.0 pg   MCHC 32.4 30.0 - 36.0 g/dL   RDW 14.9 11.5 - 15.5 %   Platelets 273 150 - 400 K/uL   nRBC 0.0 0.0 - 0.2 %    CT ABDOMEN PELVIS W CONTRAST  Result Date: 10/04/2019 CLINICAL DATA:  Acute nonlocalized abdominal pain EXAM: CT ABDOMEN AND PELVIS WITH CONTRAST TECHNIQUE: Multidetector CT imaging of the abdomen and pelvis was performed using the standard protocol following bolus administration of intravenous contrast. Automatic exposure control utilized. CONTRAST:  192mL OMNIPAQUE IOHEXOL 300 MG/ML  SOLN COMPARISON:  None. FINDINGS: Lower chest: Minimal subpleural atelectasis. No dependent pleural effusion. Normal heart size. Small hiatal hernia. Hepatobiliary: Borderline hepatomegaly measuring 22 cm along the right lateral margin. Patent hepatic and portal veins. Normal gallbladder and biliary tree. Normal smooth hepatic contours without ascites. Pancreas: Normal. Spleen: Normal. Adrenals/Urinary Tract: Normal. Stomach/Bowel: No obstruction. Questionable mild diffuse mucosal thickening diffusely in the pelvic small bowel bowel loops. No mucosal thickening of the stomach or colon. Paucity of stool. Normal appendix, series 3, image 56. Vascular/Lymphatic: No abdominal aortic atherosclerosis,  aneurysmal dilatation, or dissection. 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 No adenopathy. Small retroperitoneal lymph nodes measuring up to 10 x 9 mm in the right para-aortic region. Reproductive: A left ovarian 3 cm water-density cyst, likely a benign functional cyst based on size criteria; no additional imaging characterization or surveillance is recommended unless clinically indicated. Bilateral tubal ligation clips. No apparent uterine abnormality. Trace 26 Hounsfield unit free fluid in the rectouterine pouch, likely physiologic. Other: Intact abdominal wall. Musculoskeletal: Normal. Benign bone island in the left femur inter trochanteric region. IMPRESSION: Borderline hepatomegaly. A left ovarian 3 cm benign functional cyst with trace free fluid in the rectouterine pouch that is likely physiologic. No additional imaging characterization or surveillance is recommended unless clinically indicated. Questionable mild diffuse mucosal thickening of the pelvic small bowel loops which could be inflammatory or infectious. It is also possible this appearance is due to incomplete distension of these loops by enteric contrast material. Electronically Signed   By: Revonda Humphrey   On: 10/04/2019 17:53   US PELVIC COMPLETE W TRANSVAGINAL AND TORSION R/O  Result Date: 10/04/2019 CLINICAL DATA:  Bilateral pelvic pain. EXAM: TRANSABDOMINAL AND TRANSVAGINAL ULTRASOUND OF PELVIS DOPPLER ULTRASOUND OF OVARIES TECHNIQUE: Both transabdominal and transvaginal ultrasound examinations of the pelvis were performed. Transabdominal technique was performed for  global imaging of the pelvis including uterus, ovaries, adnexal regions, and pelvic cul-de-sac. It was necessary to proceed with endovaginal exam following the transabdominal exam to visualize the the uterus and ovaries. Color and duplex Doppler ultrasound was utilized to evaluate blood flow to the ovaries. COMPARISON:   01/19/2017. FINDINGS: Uterus Measurements: 8.7 x 4.7 x 4.9 cm = volume: 105.8 mL. 1.1 cm calcified right posterior fibroid again noted. Endometrium Thickness: 4 mm.  No focal abnormality visualized. Right ovary Measurements: 2.4 x 1.9 x 2.0 cm = volume: 4.7 mL. Difficult to visualize but no focal abnormality identified. Left ovary Measurements: 3.1 x 2.3 x 2.7 cm = volume: 9.9 mL. Normal appearance/no adnexal mass. Pulsed Doppler evaluation of both ovaries demonstrates normal low-resistance arterial and venous waveforms. Other findings No abnormal free fluid.  Limited exam due to patient's body habitus. IMPRESSION: 1.  1.1 cm calcified right posterior fibroid again noted. 2.  Exam otherwise unremarkable. Electronically Signed   By: Marcello Moores  Register   On: 10/04/2019 16:38    Assessment/Plan: Probable PID I will treat with IV abx and follow clinically Check CBC daily  Elmus Mathes C Diasia Henken 10/05/2019, 5:57 AM

## 2019-10-05 NOTE — Progress Notes (Signed)
[facility]ulty [facility] OB/[facility]N Attending Note  Came to see patient to discuss results with her. She has a positive gonorrhea culture which caused her acute PID.  Negative tests for Chlamydia, Trichomonas, HIV (RPR, Hep B and Hep C still pending).  Explained that this gonorrhea infection can be associated with inflammation in the pelvis and abdomen and around her liver; can cause chronic pelvic pain and other sequelae.  Ordered for Ceftriaxone 500 mg IM x 1; changed PID regimen to IV Cefotetan with PO Doxycycline and PO Metronidazole.  Told her that she will be discharged home tomorrow to complete a 14 day course of the the oral Doxycycline and Metronidazole and analgesia as needed.  Last fever was 100.4 F at 10/04/19 18:47.  Recommend that she needs to let her sex partner(s) know so they can get STI testing and treatment. Patient and sex partner(s) should abstain from unprotected sexual activity for at least two weeks after everyone receives appropriate treatment.  She was informed this was a reportable infection, the Health Department will be notified and may contact her.  Safe sex practices recommended for patient.   Continue routine care.  RN team notified of change in regimen for patient.    Verita Schneiders, MD, [facility] [employer], [place]

## 2019-10-05 NOTE — Progress Notes (Signed)
Patient admitted thru ED with pelvic pain. A/Ox4 on arrival. V/S checked and stable. Oriented to room and unit and made her comfortable in bed. Call bell within reach. Will continue to monitor.

## 2019-10-06 LAB — CBC
HCT: 33.9 % — ABNORMAL LOW (ref 36.0–46.0)
Hemoglobin: 11 g/dL — ABNORMAL LOW (ref 12.0–15.0)
MCH: 27.6 pg (ref 26.0–34.0)
MCHC: 32.4 g/dL (ref 30.0–36.0)
MCV: 85 fL (ref 80.0–100.0)
Platelets: 284 10*3/uL (ref 150–400)
RBC: 3.99 MIL/uL (ref 3.87–5.11)
RDW: 14.6 % (ref 11.5–15.5)
WBC: 19.2 10*3/uL — ABNORMAL HIGH (ref 4.0–10.5)
nRBC: 0 % (ref 0.0–0.2)

## 2019-10-06 MED ORDER — HYDROCODONE-ACETAMINOPHEN 5-325 MG PO TABS: 1.0000 | ORAL_TABLET | Freq: Four times a day (QID) | ORAL | 0 refills | Status: AC | PRN

## 2019-10-06 MED ORDER — DOXYCYCLINE HYCLATE 100 MG PO TABS: 100.0000 mg | ORAL_TABLET | Freq: Two times a day (BID) | ORAL | 0 refills | Status: AC

## 2019-10-06 MED ORDER — METRONIDAZOLE 500 MG PO TABS: 500.0000 mg | ORAL_TABLET | Freq: Two times a day (BID) | ORAL | 0 refills | Status: AC

## 2019-10-06 MED ORDER — BISACODYL 10 MG RE SUPP
10.0000 mg | Freq: Every day | RECTAL | Status: DC | PRN
  Filled 2019-10-06: qty 1

## 2019-10-06 MED ORDER — IBUPROFEN 800 MG PO TABS: 800.0000 mg | ORAL_TABLET | Freq: Three times a day (TID) | ORAL | 2 refills | Status: AC | PRN

## 2019-10-06 MED ORDER — PROMETHAZINE HCL 25 MG PO TABS: 25.0000 mg | ORAL_TABLET | Freq: Four times a day (QID) | ORAL | 2 refills | Status: AC | PRN

## 2019-10-06 NOTE — Discharge Summary (Signed)
Physician Discharge Summary  Patient ID: Breanna Yates MRN: [id number] DOB/AGE: 1985-09-02 34 y.o.  Admit date: 10/04/2019 Discharge date: 10/06/2019  Admission and Discharge Diagnoses:  Principal Problem:   PID (acute pelvic inflammatory disease) Active Problems:   Gonorrhea  Discharged Condition: Stable  Hospital Course: Patient was admitted for severe abdominopelvic pain with the diagnosis of PID.  Started on Cefoxitin and Doxycycline, analgesia as needed.  Cultures resulted with positive gonorrhea, also had BV. Patient informed, other STI screening negative. Received Rocephin 500 mg IM x 1, and antibiotics changed to Cefotetan, and oral Doxycycline and Metronidazole.  On day of discharge, pain was improved, she was afebrile and tolerating oral intake.  She was deemed stable for discharge with outpatient follow up.   Consults: None  Significant Diagnostic Studies: Results for orders placed or performed during the hospital encounter of 10/04/19 (from the past 72 hour(s))  GC/Chlamydia probe amp ([place]) not at [facility]     Status: Abnormal   Collection Time: 10/04/19 12:00 AM  Result Value Ref Range   Chlamydia Negative     Comment: Normal Reference Range - Negative   Neisseria Gonorrhea **POSITIVE** (A)     Comment: Normal Reference Range - Negative  Pregnancy, urine POC     Status: None   Collection Time: 10/04/19 11:56 AM  Result Value Ref Range   Preg Test, Ur NEGATIVE NEGATIVE    Comment:        THE SENSITIVITY OF THIS METHODOLOGY IS >24 mIU/mL   Comprehensive metabolic panel     Status: Abnormal   Collection Time: 10/04/19  1:35 PM  Result Value Ref Range   Sodium 135 135 - 145 mmol/L   Potassium 3.5 3.5 - 5.1 mmol/L   Chloride 101 98 - 111 mmol/L   CO2 21 (L) 22 - 32 mmol/L   Glucose, Bld 141 (H) 70 - 99 mg/dL    Comment: Glucose reference range applies only to samples taken after fasting for at least 8 hours.   BUN 7 6 - 20 mg/dL   Creatinine, Ser 0.75 0.44 -  1.00 mg/dL   Calcium 9.4 8.9 - 10.3 mg/dL   Total Protein 7.6 6.5 - 8.1 g/dL   Albumin 3.7 3.5 - 5.0 g/dL   AST 17 15 - 41 U/L   ALT 15 0 - 44 U/L   Alkaline Phosphatase 62 38 - 126 U/L   Total Bilirubin 0.6 0.3 - 1.2 mg/dL   GFR calc non Af Amer >60 >60 mL/min   GFR calc Af Amer >60 >60 mL/min   Anion gap 13 5 - 15    Comment: Performed at [facility], [place] [address] [place], [place] [address]  Lipase, blood     Status: None   Collection Time: 10/04/19  1:35 PM  Result Value Ref Range   Lipase 15 11 - 51 U/L    Comment: Performed at [facility] [address], [place], [place] [address]  CBC with Diff     Status: Abnormal   Collection Time: 10/04/19  1:35 PM  Result Value Ref Range   WBC 37.1 (H) 4.0 - 10.5 K/uL   RBC 4.44 3.87 - 5.11 MIL/uL   Hemoglobin 12.3 12.0 - 15.0 g/dL   HCT 38.3 36.0 - 46.0 %   MCV 86.3 80.0 - 100.0 fL   MCH 27.7 26.0 - 34.0 pg   MCHC 32.1 30.0 - 36.0 g/dL   RDW 14.8 11.5 - 15.5 %  Platelets 289 150 - 400 K/uL   nRBC 0.0 0.0 - 0.2 %   Neutrophils Relative % 97 %   Neutro Abs 36.0 (H) 1.7 - 7.7 K/uL   Lymphocytes Relative 1 %   Lymphs Abs 0.4 (L) 0.7 - 4.0 K/uL   Monocytes Relative 2 %   Monocytes Absolute 0.7 0.1 - 1.0 K/uL   Eosinophils Relative 0 %   Eosinophils Absolute 0.0 0.0 - 0.5 K/uL   Basophils Relative 0 %   Basophils Absolute 0.0 0.0 - 0.1 K/uL   nRBC 0 0 /100 WBC   Abs Immature Granulocytes 0.00 0.00 - 0.07 K/uL    Comment: Performed at [facility] [address], [place], [place] [address]  I-Stat beta hCG blood, ED     Status: None   Collection Time: 10/04/19  1:37 PM  Result Value Ref Range   I-stat hCG, quantitative <5.0 <5 mIU/mL   Comment 3            Comment:   GEST. AGE      CONC.  (mIU/mL)   <=1 WEEK        5 - 50     2 WEEKS       50 - 500     3 WEEKS       100 - 10,000     4 WEEKS     1,000 - 30,000        FEMALE AND NON-PREGNANT FEMALE:     LESS THAN 5 mIU/mL   Lactic acid, plasma      Status: Abnormal   Collection Time: 10/04/19  1:45 PM  Result Value Ref Range   Lactic Acid, Venous 2.3 (HH) 0.5 - 1.9 mmol/L    Comment: CRITICAL RESULT CALLED TO, READ BACK BY AND VERIFIED WITH: [facility] RN @1420  ON [id number] BY FLEMINGS Performed at [facility], [place] [address] [place], [place] [address]   Wet prep, genital     Status: Abnormal   Collection Time: 10/04/19  2:20 PM   Specimen: PATH Cytology Cervicovaginal Ancillary Only  Result Value Ref Range   Yeast Wet Prep HPF POC NONE SEEN NONE SEEN   Trich, Wet Prep NONE SEEN NONE SEEN   Clue Cells Wet Prep HPF POC PRESENT (A) NONE SEEN   WBC, Wet Prep HPF POC MANY (A) NONE SEEN   Sperm NONE SEEN     Comment: Performed at [facility], [place] [address], [place], [place] [address]  Lactic acid, plasma     Status: None   Collection Time: 10/04/19  3:26 PM  Result Value Ref Range   Lactic Acid, Venous 1.1 0.5 - 1.9 mmol/L    Comment: Performed at [facility] [address], [place], [place] [address]  HIV Antibody (routine testing w rflx)     Status: None   Collection Time: 10/04/19  3:26 PM  Result Value Ref Range   HIV Screen 4th Generation wRfx NON REACTIVE NON REACTIVE    Comment: Performed at [facility], [place] [address] [place], [place] [address]  RPR     Status: None   Collection Time: 10/04/19  3:26 PM  Result Value Ref Range   RPR Ser Ql NON REACTIVE NON REACTIVE    Comment: Performed at [facility], [place] [address] [place], [place] [address]  Urinalysis, Routine w reflex microscopic     Status: Abnormal   Collection Time: 10/04/19  5:33 PM  Result Value Ref Range   Color, Urine STRAW (A) YELLOW   APPearance CLEAR CLEAR   Specific Gravity, Urine 1.015 1.005 - 1.030   pH 6.0 5.0 - 8.0   Glucose, UA NEGATIVE NEGATIVE mg/dL   Hgb urine dipstick NEGATIVE NEGATIVE   Bilirubin Urine NEGATIVE NEGATIVE   Ketones, ur NEGATIVE NEGATIVE mg/dL   Protein, ur NEGATIVE NEGATIVE mg/dL    Nitrite NEGATIVE NEGATIVE   Leukocytes,Ua NEGATIVE NEGATIVE    Comment: Performed at [facility] [address], [place], [place] [address]  Blood culture (routine x 2)     Status: None (Preliminary result)   Collection Time: 10/04/19  6:02 PM   Specimen: BLOOD  Result Value Ref Range   Specimen Description BLOOD RIGHT ANTECUBITAL    Special Requests      BOTTLES DRAWN AEROBIC AND ANAEROBIC Blood Culture adequate volume   Culture      NO GROWTH 2 DAYS Performed at [facility], [place] [address], [place], [place] [address]    Report Status PENDING   Blood culture (routine x 2)     Status: None (Preliminary result)   Collection Time: 10/04/19  6:02 PM   Specimen: BLOOD LEFT HAND  Result Value Ref Range   Specimen Description BLOOD LEFT HAND    Special Requests      BOTTLES DRAWN AEROBIC AND ANAEROBIC Blood Culture adequate volume   Culture      NO GROWTH 2 DAYS Performed at [facility], [place] [address] [place], [place] [address]    Report Status PENDING   SARS CORONAVIRUS 2 (TAT 6-24 HRS) Nasopharyngeal Nasopharyngeal Swab     Status: None   Collection Time: 10/04/19  8:22 PM   Specimen: Nasopharyngeal Swab  Result Value Ref Range   SARS Coronavirus 2 NEGATIVE NEGATIVE    Comment: (NOTE) SARS-CoV-2 target nucleic acids are NOT DETECTED. The SARS-CoV-2 RNA is generally detectable in upper and lower respiratory specimens during the acute phase of infection. Negative results do not preclude SARS-CoV-2 infection, do not rule out co-infections with other pathogens, and should not be used as the sole basis for treatment or other patient management decisions. Negative results must be combined with clinical observations, patient history, and epidemiological information. The expected result is Negative. Fact Sheet for Patients: SugarRoll.be Fact Sheet for Healthcare Providers: https://www.woods-mathews.com/ This test is not  yet approved or cleared by the [place] FDA and  has been authorized for detection and/or diagnosis of SARS-CoV-2 by FDA under an Emergency Use Authorization (EUA). This EUA will remain  in effect (meaning this test can be used) for the duration of the COVID-19 declaration under Section 56 4(b)(1) of the Act, 21 U.S.C. section 360bbb-3(b)(1), unless the authorization is terminated or revoked sooner. Performed at [facility], [place] [address], [place], [place] [address]   CBC     Status: Abnormal   Collection Time: 10/05/19  2:44 AM  Result Value Ref Range   WBC 35.9 (H) 4.0 - 10.5 K/uL   RBC 4.09 3.87 - 5.11 MIL/uL   Hemoglobin 11.3 (L) 12.0 - 15.0 g/dL   HCT 34.9 (L) 36.0 - 46.0 %   MCV 85.3 80.0 - 100.0 fL   MCH 27.6 26.0 - 34.0 pg   MCHC 32.4 30.0 - 36.0 g/dL   RDW 14.9 11.5 - 15.5 %   Platelets 273 150 - 400 K/uL   nRBC 0.0 0.0 - 0.2 %  Comment: Performed at [facility], [place] [address] [place], [place] [address]  Hepatitis C antibody     Status: None   Collection Time: 10/05/19 10:19 AM  Result Value Ref Range   HCV Ab NON REACTIVE NON REACTIVE    Comment: (NOTE) Nonreactive HCV antibody screen is consistent with no HCV infections,  unless recent infection is suspected or other evidence exists to indicate HCV infection. Performed at [facility], [place] [address] [place], [place] [address]   Hepatitis B surface antigen     Status: None   Collection Time: 10/05/19 10:19 AM  Result Value Ref Range   Hepatitis B Surface Ag NON REACTIVE NON REACTIVE    Comment: Performed at [facility] [address], [place],  [address]  CBC     Status: Abnormal   Collection Time: 10/06/19  5:28 AM  Result Value Ref Range   WBC 19.2 (H) 4.0 - 10.5 K/uL   RBC 3.99 3.87 - 5.11 MIL/uL   Hemoglobin 11.0 (L) 12.0 - 15.0 g/dL   HCT 33.9 (L) 36.0 - 46.0 %   MCV 85.0 80.0 - 100.0 fL   MCH 27.6 26.0 - 34.0 pg   MCHC 32.4 30.0 - 36.0 g/dL   RDW 14.6  11.5 - 15.5 %   Platelets 284 150 - 400 K/uL   nRBC 0.0 0.0 - 0.2 %    Comment: Performed at [facility], [place] [address] [place],  [address]   CT ABDOMEN PELVIS W CONTRAST  Result Date: 10/04/2019 CLINICAL DATA:  Acute nonlocalized abdominal pain EXAM: CT ABDOMEN AND PELVIS WITH CONTRAST TECHNIQUE: Multidetector CT imaging of the abdomen and pelvis was performed using the standard protocol following bolus administration of intravenous contrast. Automatic exposure control utilized. CONTRAST:  119mL OMNIPAQUE IOHEXOL 300 MG/ML  SOLN COMPARISON:  None. FINDINGS: Lower chest: Minimal subpleural atelectasis. No dependent pleural effusion. Normal heart size. Small hiatal hernia. Hepatobiliary: Borderline hepatomegaly measuring 22 cm along the right lateral margin. Patent hepatic and portal veins. Normal gallbladder and biliary tree. Normal smooth hepatic contours without ascites. Pancreas: Normal. Spleen: Normal. Adrenals/Urinary Tract: Normal. Stomach/Bowel: No obstruction. Questionable mild diffuse mucosal thickening diffusely in the pelvic small bowel bowel loops. No mucosal thickening of the stomach or colon. Paucity of stool. Normal appendix, series 3, image 56. Vascular/Lymphatic: No abdominal aortic atherosclerosis, aneurysmal dilatation, or dissection. 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 No adenopathy. Small retroperitoneal lymph nodes measuring up to 10 x 9 mm in the right para-aortic region. Reproductive: A left ovarian 3 cm water-density cyst, likely a benign functional cyst based on size criteria; no additional imaging characterization or surveillance is recommended unless clinically indicated. Bilateral tubal ligation clips. No apparent uterine abnormality. Trace 26 Hounsfield unit free fluid in the rectouterine pouch, likely physiologic. Other: Intact abdominal wall. Musculoskeletal: Normal. Benign bone island in the left femur  inter trochanteric region. IMPRESSION: Borderline hepatomegaly. A left ovarian 3 cm benign functional cyst with trace free fluid in the rectouterine pouch that is likely physiologic. No additional imaging characterization or surveillance is recommended unless clinically indicated. Questionable mild diffuse mucosal thickening of the pelvic small bowel loops which could be inflammatory or infectious. It is also possible this appearance is due to incomplete distension of these loops by enteric contrast material. Electronically Signed   By: Revonda Humphrey   On: 10/04/2019 17:53   US PELVIC COMPLETE W  TRANSVAGINAL AND TORSION R/O  Result Date: 10/04/2019 CLINICAL DATA:  Bilateral pelvic pain. EXAM: TRANSABDOMINAL AND TRANSVAGINAL ULTRASOUND OF PELVIS DOPPLER ULTRASOUND OF OVARIES TECHNIQUE: Both transabdominal and transvaginal ultrasound examinations of the pelvis were performed. Transabdominal technique was performed for global imaging of the pelvis including uterus, ovaries, adnexal regions, and pelvic cul-de-sac. It was necessary to proceed with endovaginal exam following the transabdominal exam to visualize the the uterus and ovaries. Color and duplex Doppler ultrasound was utilized to evaluate blood flow to the ovaries. COMPARISON:  01/19/2017. FINDINGS: Uterus Measurements: 8.7 x 4.7 x 4.9 cm = volume: 105.8 mL. 1.1 cm calcified right posterior fibroid again noted. Endometrium Thickness: 4 mm.  No focal abnormality visualized. Right ovary Measurements: 2.4 x 1.9 x 2.0 cm = volume: 4.7 mL. Difficult to visualize but no focal abnormality identified. Left ovary Measurements: 3.1 x 2.3 x 2.7 cm = volume: 9.9 mL. Normal appearance/no adnexal mass. Pulsed Doppler evaluation of both ovaries demonstrates normal low-resistance arterial and venous waveforms. Other findings No abnormal free fluid.  Limited exam due to patient's body habitus. IMPRESSION: 1.  1.1 cm calcified right posterior fibroid again noted. 2.  Exam  otherwise unremarkable. Electronically Signed   By: Marcello Moores  Register   On: 10/04/2019 16:38    Discharge Exam: Blood pressure 126/72, pulse 79, temperature 98.6 F (37 C), temperature source Oral, resp. rate 18, height 5\' 7"  (1.702 m), weight 99.8 kg, SpO2 100 %. General appearance: alert and no distress Resp: normal breath sounds Cardio: regular rate and rhythm GI: soft, mild diffuse tenderness; no masses,  no organomegaly Pelvic: deferred Extremities: extremities normal, atraumatic, no cyanosis or edema and Homans sign is negative, no sign of DVT Skin: Skin color, texture, turgor normal. No rashes or lesions Neurologic: Grossly normal  Disposition: Discharge disposition: 01-Home or Self Care        Allergies as of 10/06/2019      Reactions   Pineapple Itching   Oral itching, no swelling      Medication List    TAKE these medications   cetirizine 10 MG tablet Commonly known as: ZYRTEC Take 1 tablet (10 mg total) by mouth 2 (two) times daily.   doxycycline 100 MG tablet Commonly known as: VIBRA-TABS Take 1 tablet (100 mg total) by mouth 2 (two) times daily for 14 days.   escitalopram 10 MG tablet Commonly known as: LEXAPRO TAKE 1 TABLET BY MOUTH TWICE A DAY What changed: when to take this   hydrochlorothiazide 25 MG tablet Commonly known as: HYDRODIURIL TAKE 1 TABLET BY MOUTH EVERY DAY   HYDROcodone-acetaminophen 5-325 MG tablet Commonly known as: NORCO/VICODIN Take 1-2 tablets by mouth every 6 (six) hours as needed for moderate pain or severe pain.   ibuprofen 800 MG tablet Commonly known as: ADVIL Take 1 tablet (800 mg total) by mouth every 8 (eight) hours as needed for fever, headache, mild pain, moderate pain or cramping.   metroNIDAZOLE 500 MG tablet Commonly known as: FLAGYL Take 1 tablet (500 mg total) by mouth 2 (two) times daily for 14 days.   promethazine 25 MG tablet Commonly known as: PHENERGAN Take 1 tablet (25 mg total) by mouth every 6  (six) hours as needed for nausea or vomiting.      Follow-up Information    [facility] Follow up in 2 week(s).   Specialty: Obstetrics and Gynecology Why: You will be called with appointment details Contact information: [address], [place]  [place] [address] [phone]          Signed: Verita Schneiders, MD 10/06/2019, 7:46 AM

## 2019-10-06 NOTE — Discharge Instructions (Signed)
Gonorrhea Gonorrhea is a sexually transmitted disease (STD) that can affect both men and women. If left untreated, this infection can:  Damage the female or female organs.  Cause women and men to be unable to have children (be sterile).  Harm a fetus if an infected woman is pregnant. It is important to get treatment for gonorrhea as soon as possible. It is also necessary for all of your sexual partners to be tested for the infection. What are the causes? This condition is caused by bacteria called Neisseria gonorrhoeae. The infection is spread from person to person through sexual contact, including oral, anal, and vaginal sex. A newborn can contract the infection from his or her mother during birth. What increases the risk? The following factors may make you more likely to develop this condition:  Being a woman who is younger than 32 years of age and who is sexually active.  Being a woman 25 years of age or older who has: ? A new sex partner. ? More than one sex partner. ? A sex partner who has an STD.  Being a man who has: ? A new sex partner. ? More than one sex partner. ? A sex partner who has an STD.  Using condoms inconsistently.  Currently having, or having previously had, an STD.  Exchanging sex for money or drugs. What are the signs or symptoms? Some people do not have any symptoms. If you do have symptoms, they may be different for females and males. For females  Pain in the lower abdomen.  Abnormal vaginal discharge. The discharge may be cloudy, thick, or yellow-green in color.  Bleeding between periods.  Painful sex.  Burning or itching in and around the vagina.  Pain or burning when urinating.  Irritation, pain, bleeding, or discharge from the rectum. This may occur if the infection was spread by anal sex.  Sore throat or swollen lymph nodes in the neck. This may occur if the infection was spread by oral sex. For males  Abnormal discharge from the penis.  This discharge may be cloudy, thick, or yellow-green in color.  Pain or burning during urination.  Pain or swelling in the testicles.  Irritation, pain, bleeding, or discharge from the rectum. This may occur if the infection was spread by anal sex.  Sore throat, fever, or swollen lymph nodes in the neck. This may occur if the infection was spread by oral sex. How is this diagnosed? This condition is diagnosed based on:  A physical exam.  A sample of discharge that is examined under a microscope to look for the bacteria. The discharge may be taken from the urethra, cervix, throat, or rectum.  Urine tests. Not all of test results will be available during your visit. How is this treated? This condition is treated with antibiotic medicines. It is important for treatment to begin as soon as possible. Early treatment may prevent some problems from developing. Do not have sex during treatment. Avoid all types of sexual activity for 7 days after treatment is complete and until any sex partners have been treated. Follow these instructions at home:  Take over-the-counter and prescription medicines only as told by your health care provider.  Take your antibiotic medicine as told by your health care provider. Do not stop taking the antibiotic even if you start to feel better.  Do not have sex until at least 7 days after you and your partner(s) have finished treatment and your health care provider says it is okay.    It is your responsibility to get your test results. Ask your health care provider, or the department performing the test, when your results will be ready.  If you test positive for gonorrhea, inform your recent sexual partners. This includes any oral, anal, or vaginal sex partners. They need to be checked for gonorrhea even if they do not have symptoms. They may need treatment, even if they test negative for gonorrhea.  Keep all follow-up visits as told by your health care provider.  This is important. How is this prevented?   Use latex condoms correctly every time you have sexual intercourse.  Ask if your sexual partner has been tested for STDs and had negative results.  Avoid having multiple sexual partners. Contact a health care provider if:  You develop a bad reaction to the medicine you were prescribed. This may include: ? A rash. ? Nausea. ? Vomiting. ? Diarrhea.  Your symptoms do not get better after a few days of taking antibiotics.  Your symptoms get worse.  You develop new symptoms.  Your pain gets worse.  You have a fever.  You develop pain, itching, or discharge around the eyes. Get help right away if:  You feel dizzy or faint.  You have trouble breathing or have shortness of breath.  You develop an irregular heartbeat.  You have severe abdominal pain with or without shoulder pain.  You develop any bumps or sores (lesions) on your skin.  You develop warmth, redness, pain, or swelling around your joints, such as the knee. Summary  Gonorrhea is an STD that can affect both men and women.  This condition is caused by bacteria called Neisseria gonorrhoeae. The infection is spread from person to person, usually through sexual contact, including oral, anal, and vaginal sex.  Symptoms vary between males and females. Generally, they include abnormal discharge and burning during urination. Women may also experience painful sex, itching around the vagina, and bleeding between menstrual periods. Men may also experience swelling of the testicles.  This condition is treated with antibiotic medicines. Do not have sex until at least 7 days after completing antibiotic treatment.  If left untreated, gonorrhea can have serious side effects and complications. This information is not intended to replace advice given to you by your health care provider. Make sure you discuss any questions you have with your health care provider. Document Revised:  11/27/2018 Document Reviewed: 06/03/2016 Elsevier Patient Education  [place].    Pelvic Inflammatory Disease  Pelvic inflammatory disease (PID) is caused by an infection in some or all of the female reproductive organs. The infection can be in the uterus, ovaries, fallopian tubes, or the surrounding tissues in the pelvis. PID can cause abdominal or pelvic pain that comes on suddenly (acute pelvic pain). PID is a serious infection because it can lead to lasting (chronic) pelvic pain or the inability to have children (infertility). What are the causes? This condition is most often caused by bacteria that is spread during sexual contact. It can also be caused by a bacterial infection of the vagina (bacterial vaginosis) that is not spread by sexual contact. This condition occurs when the infection is not treated and the bacteria travel upward from the vagina or cervix into the reproductive organs. Bacteria may also be introduced into the reproductive organs following:  The birth of a baby.  A miscarriage.  An abortion.  Major pelvic surgery.  The insertion of an intrauterine device (IUD).  A sexual assault. What increases the risk?  You are more likely to develop this condition if you:  Are younger than 32 years of age.  Are sexually active at a young age.  Have a history of STI (sexually transmitted infection) or PID.  Do not regularly use barrier contraception methods, such as condoms.  Have multiple sexual partners.  Have sex with someone who has symptoms of an STI.  Use a vaginal douche.  Have recently had an IUD inserted. What are the signs or symptoms? Symptoms of this condition include:  Abdominal or pelvic pain.  Fever.  Chills.  Abnormal vaginal discharge.  Abnormal uterine bleeding.  Unusual pain shortly after the end of a menstrual period.  Painful urination.  Pain with sex.  Nausea and vomiting. How is this diagnosed? This condition is  diagnosed based on a pelvic exam and medical history. A pelvic exam can reveal signs of infection, inflammation, and discharge in the vagina and the surrounding tissues. It can also help to identify painful areas. You may also have tests, such as:  Lab tests, including a pregnancy test, blood tests, and a urine test.  Culture tests of the vagina and cervix to check for an STI.  Ultrasound.  A laparoscopic procedure to look inside the pelvis.  Examination of vaginal discharge under a microscope. How is this treated? This condition may be treated with:  Antibiotic medicines taken by mouth (orally). For more severe cases, antibiotics may be given through an IV at the hospital.  Surgery. This is rare. Surgery may be needed if other treatments do not help.  Efforts to stop the spread of the infection. Sexual partners may need to be treated if the infection is caused by an STI. It may take weeks until you are completely well. If you are diagnosed with PID, you should also be checked for HIV (human immunodeficiency virus). Your health care provider may test you for infection again 3 months after treatment. You should not have unprotected sex. Follow these instructions at home:  Take over-the-counter and prescription medicines only as told by your health care provider.  If you were prescribed an antibiotic medicine, take it as told by your health care provider. Do not stop using the antibiotic even if you start to feel better.  Do not have sex until treatment is completed or as told by your health care provider. If PID is confirmed, your recent sexual partners will need treatment, especially if you had unprotected sex.  Keep all follow-up visits as told by your health care provider. This is important. Contact a health care provider if:  You have increased or abnormal vaginal discharge.  Your pain does not improve.  You vomit.  You have a fever.  You cannot tolerate your  medicines.  Your partner has an STI.  You have pain when you urinate. Get help right away if:  You have increased abdominal or pelvic pain.  You have chills.  Your symptoms are not better in 72 hours with treatment. Summary  Pelvic inflammatory disease (PID) is caused by an infection in some or all of the female reproductive organs.  PID is a serious infection because it can lead to lasting (chronic) pelvic pain or the inability to have children (infertility).  This infection is usually treated with antibiotic medicines.  Do not have sex until treatment is completed or as told by your health care provider. This information is not intended to replace advice given to you by your health care provider. Make sure you discuss any questions  you have with your health care provider. Document Revised: 03/22/2018 Document Reviewed: 03/27/2018 Elsevier Patient Education  [place].

## 2019-10-06 NOTE — Progress Notes (Addendum)
Pt refusing vital signs to be taken  Pt discharge education provided with pt at bedside  Pt has all belongings  Pt IV removed by NT, catheter intact  Pt refused wheelchair  Pt ambulated off unit with RN

## 2019-10-09 LAB — CULTURE, BLOOD (ROUTINE X 2)
Culture: NO GROWTH
Culture: NO GROWTH
Special Requests: ADEQUATE
Special Requests: ADEQUATE

## 2019-10-17 ENCOUNTER — Other Ambulatory Visit (HOSPITAL_COMMUNITY)
Admission: RE | Admit: 2019-10-17 | Discharge: 2019-10-17 | Disposition: A | Discharge status: Home / Self Care | Payer: Medicaid Other | Source: Ambulatory Visit | Attending: Obstetrics and Gynecology | Admitting: Obstetrics and Gynecology

## 2019-10-17 ENCOUNTER — Encounter: Payer: Self-pay | Admitting: Obstetrics and Gynecology

## 2019-10-17 ENCOUNTER — Other Ambulatory Visit: Payer: Self-pay

## 2019-10-17 ENCOUNTER — Ambulatory Visit: Payer: Medicaid Other | Admitting: Obstetrics and Gynecology

## 2019-10-17 VITALS — BP 130/81 | HR 94 | Wt 234.0 lb

## 2019-10-17 DIAGNOSIS — Z124 Encounter for screening for malignant neoplasm of cervix: Secondary | ICD-10-CM | POA: Diagnosis not present

## 2019-10-17 DIAGNOSIS — Z113 Encounter for screening for infections with a predominantly sexual mode of transmission: Secondary | ICD-10-CM

## 2019-10-17 NOTE — Progress Notes (Signed)
34 yo here for follow up on PID. Patient discharged from hospital on 3/21 with positive gonorrhea test. Patient is still completing antibiotics. She has remained abstinent. Patient reports significant improvement in her abdominal pain. She is without complaints today  Past Medical History:  Diagnosis Date  . Chlamydia 2008  . CIN III (cervical intraepithelial neoplasia grade III) with severe dysplasia 09/18/2013   s/p Cervical Conization   . Depression    mild per pt- overwhelmed and tired  . GDM (gestational diabetes mellitus) 11/26/2015  . Gonorrhea 10/05/2019  . History of gestational diabetes mellitus (GDM)   . Hypertension   . PID (acute pelvic inflammatory disease) 10/04/2019   Past Surgical History:  Procedure Laterality Date  . CERVICAL CONIZATION W/BX N/A 09/18/2013   Procedure: CONIZATION CERVIX;  Surgeon: Frederico Hamman, MD;  Location: [facility];  Service: Gynecology;  Laterality: N/A;  . LAPAROSCOPIC TUBAL LIGATION N/A 03/01/2016   Procedure: LAPAROSCOPIC TUBAL LIGATION WITH FILSHIE CLIPS;  Surgeon: Osborne Oman, MD;  Location: [facility];  Service: Gynecology;  Laterality: N/A;  . TONGUE SURGERY    . WISDOM TOOTH EXTRACTION     Family History  Problem Relation Age of Onset  . Hypertension Mother   . Asthma Sister   . Cancer Sister        brain tumor and seizures  . Hypertension Maternal Grandmother   . Diabetes Maternal Grandmother    Social History   Tobacco Use  . Smoking status: Current Every Day Smoker    Packs/day: 0.50    Years: 13.00    Pack years: 6.50    Types: Cigarettes  . Smokeless tobacco: Never Used  Substance Use Topics  . Alcohol use: Yes    Comment: "Birthdays, Holidays"  . Drug use: No   ROS See pertinent in HPI  Blood pressure 130/81, pulse 94, weight 234 lb (106.1 kg), last menstrual period 10/01/2019. GENERAL: Well-developed, well-nourished female in no acute distress.  ABDOMEN: Soft, nontender, nondistended. No organomegaly. PELVIC:  Normal external female genitalia. Vagina is pink and rugated.  Normal discharge. Normal appearing cervix. Uterus is normal in size. No adnexal mass or tenderness. EXTREMITIES: No cyanosis, clubbing, or edema, 2+ distal pulses.  A/P 34 yo with recent diagnosis of PID - Continue antibiotic course - Pap smear today - STI screening - RTC in 2 weeks for vaginal swab

## 2019-10-17 NOTE — Progress Notes (Signed)
Pt presents for f/u after  hospital visit abdominopelvic pain  Treated for Gonorrhea 10/06/2019

## 2019-10-21 ENCOUNTER — Other Ambulatory Visit: Payer: Self-pay | Admitting: Obstetrics & Gynecology

## 2019-10-21 DIAGNOSIS — B379 Candidiasis, unspecified: Secondary | ICD-10-CM

## 2019-10-21 LAB — CYTOLOGY - PAP
Comment: NEGATIVE
Diagnosis: NEGATIVE
High risk HPV: NEGATIVE

## 2019-10-21 MED ORDER — FLUCONAZOLE 150 MG PO TABS: 150.0000 mg | ORAL_TABLET | Freq: Once | ORAL | 3 refills | Status: AC

## 2019-10-31 ENCOUNTER — Other Ambulatory Visit (HOSPITAL_COMMUNITY)
Admission: RE | Admit: 2019-10-31 | Discharge: 2019-10-31 | Disposition: A | Discharge status: Home / Self Care | Payer: Medicaid Other | Source: Ambulatory Visit | Attending: Obstetrics and Gynecology | Admitting: Obstetrics and Gynecology

## 2019-10-31 ENCOUNTER — Ambulatory Visit: Payer: Medicaid Other

## 2019-10-31 ENCOUNTER — Other Ambulatory Visit: Payer: Self-pay

## 2019-10-31 DIAGNOSIS — Z113 Encounter for screening for infections with a predominantly sexual mode of transmission: Secondary | ICD-10-CM | POA: Diagnosis not present

## 2019-10-31 NOTE — Progress Notes (Signed)
Breanna Yates is here for STD screening.  Pt reports having a new partner and just wanted to be sure she was fine. She also requested STD lab draw. Results pending. -EH/RMA

## 2019-11-01 LAB — CERVICOVAGINAL ANCILLARY ONLY
Bacterial Vaginitis (gardnerella): NEGATIVE
Candida Glabrata: POSITIVE — AB
Candida Vaginitis: NEGATIVE
Chlamydia: NEGATIVE
Comment: NEGATIVE
Comment: NEGATIVE
Comment: NEGATIVE
Comment: NEGATIVE
Comment: NEGATIVE
Comment: NORMAL
Neisseria Gonorrhea: NEGATIVE
Trichomonas: NEGATIVE

## 2019-11-01 LAB — HEPATITIS C ANTIBODY: Hep C Virus Ab: <0.1 s/co ratio (ref 0.0–0.9)

## 2019-11-01 LAB — HIV ANTIBODY (ROUTINE TESTING W REFLEX): HIV Screen 4th Generation wRfx: NONREACTIVE

## 2019-11-01 LAB — RPR: RPR Ser Ql: NONREACTIVE

## 2019-11-04 MED ORDER — FLUCONAZOLE 150 MG PO TABS: 150.0000 mg | ORAL_TABLET | Freq: Once | ORAL | 0 refills | Status: AC

## 2019-11-04 NOTE — Addendum Note (Signed)
Addended by: Mora Bellman on: 11/04/2019 08:27 AM   Modules accepted: Orders

## 2019-11-05 DIAGNOSIS — H1013 Acute atopic conjunctivitis, bilateral: Secondary | ICD-10-CM | POA: Diagnosis not present

## 2019-11-28 DIAGNOSIS — H1013 Acute atopic conjunctivitis, bilateral: Secondary | ICD-10-CM | POA: Diagnosis not present

## 2020-02-18 ENCOUNTER — Other Ambulatory Visit: Payer: Self-pay | Admitting: Obstetrics

## 2020-02-18 DIAGNOSIS — I10 Essential (primary) hypertension: Secondary | ICD-10-CM

## 2020-03-19 DIAGNOSIS — H5213 Myopia, bilateral: Secondary | ICD-10-CM | POA: Diagnosis not present

## 2020-03-26 ENCOUNTER — Other Ambulatory Visit: Payer: Self-pay | Admitting: Obstetrics

## 2020-03-26 DIAGNOSIS — F331 Major depressive disorder, recurrent, moderate: Secondary | ICD-10-CM

## 2020-07-29 DIAGNOSIS — Z1152 Encounter for screening for COVID-19: Secondary | ICD-10-CM | POA: Diagnosis not present

## 2020-11-12 DIAGNOSIS — Z0000 Encounter for general adult medical examination without abnormal findings: Secondary | ICD-10-CM | POA: Diagnosis not present

## 2020-11-12 DIAGNOSIS — E559 Vitamin D deficiency, unspecified: Secondary | ICD-10-CM | POA: Diagnosis not present

## 2020-11-12 DIAGNOSIS — R5383 Other fatigue: Secondary | ICD-10-CM | POA: Diagnosis not present

## 2020-11-12 DIAGNOSIS — Z114 Encounter for screening for human immunodeficiency virus [HIV]: Secondary | ICD-10-CM | POA: Diagnosis not present

## 2020-11-12 DIAGNOSIS — E7800 Pure hypercholesterolemia, unspecified: Secondary | ICD-10-CM | POA: Diagnosis not present

## 2020-11-12 DIAGNOSIS — Z7251 High risk heterosexual behavior: Secondary | ICD-10-CM | POA: Diagnosis not present

## 2020-11-12 DIAGNOSIS — Z131 Encounter for screening for diabetes mellitus: Secondary | ICD-10-CM | POA: Diagnosis not present

## 2020-11-12 DIAGNOSIS — Z1159 Encounter for screening for other viral diseases: Secondary | ICD-10-CM | POA: Diagnosis not present

## 2020-11-23 DIAGNOSIS — R51 Headache with orthostatic component, not elsewhere classified: Secondary | ICD-10-CM | POA: Diagnosis not present

## 2020-12-30 DIAGNOSIS — J309 Allergic rhinitis, unspecified: Secondary | ICD-10-CM | POA: Diagnosis not present

## 2020-12-30 DIAGNOSIS — Z1152 Encounter for screening for COVID-19: Secondary | ICD-10-CM | POA: Diagnosis not present

## 2020-12-30 DIAGNOSIS — J069 Acute upper respiratory infection, unspecified: Secondary | ICD-10-CM | POA: Diagnosis not present

## 2021-01-05 DIAGNOSIS — J3089 Other allergic rhinitis: Secondary | ICD-10-CM | POA: Diagnosis not present

## 2021-01-05 DIAGNOSIS — L299 Pruritus, unspecified: Secondary | ICD-10-CM | POA: Diagnosis not present

## 2021-01-05 DIAGNOSIS — L501 Idiopathic urticaria: Secondary | ICD-10-CM | POA: Diagnosis not present

## 2021-01-05 DIAGNOSIS — H6983 Other specified disorders of Eustachian tube, bilateral: Secondary | ICD-10-CM | POA: Diagnosis not present

## 2021-01-07 DIAGNOSIS — L299 Pruritus, unspecified: Secondary | ICD-10-CM | POA: Diagnosis not present

## 2021-01-07 DIAGNOSIS — J3089 Other allergic rhinitis: Secondary | ICD-10-CM | POA: Diagnosis not present

## 2021-01-07 DIAGNOSIS — L501 Idiopathic urticaria: Secondary | ICD-10-CM | POA: Diagnosis not present

## 2021-01-13 DIAGNOSIS — J3089 Other allergic rhinitis: Secondary | ICD-10-CM | POA: Diagnosis not present

## 2021-01-13 DIAGNOSIS — H6983 Other specified disorders of Eustachian tube, bilateral: Secondary | ICD-10-CM | POA: Diagnosis not present

## 2021-01-13 DIAGNOSIS — L299 Pruritus, unspecified: Secondary | ICD-10-CM | POA: Diagnosis not present

## 2021-01-13 DIAGNOSIS — L501 Idiopathic urticaria: Secondary | ICD-10-CM | POA: Diagnosis not present

## 2021-02-02 DIAGNOSIS — L299 Pruritus, unspecified: Secondary | ICD-10-CM | POA: Diagnosis not present

## 2021-02-02 DIAGNOSIS — H6983 Other specified disorders of Eustachian tube, bilateral: Secondary | ICD-10-CM | POA: Diagnosis not present

## 2021-02-02 DIAGNOSIS — J3089 Other allergic rhinitis: Secondary | ICD-10-CM | POA: Diagnosis not present

## 2021-02-02 DIAGNOSIS — L501 Idiopathic urticaria: Secondary | ICD-10-CM | POA: Diagnosis not present

## 2021-03-31 DIAGNOSIS — H6983 Other specified disorders of Eustachian tube, bilateral: Secondary | ICD-10-CM | POA: Diagnosis not present

## 2021-03-31 DIAGNOSIS — L299 Pruritus, unspecified: Secondary | ICD-10-CM | POA: Diagnosis not present

## 2021-03-31 DIAGNOSIS — L501 Idiopathic urticaria: Secondary | ICD-10-CM | POA: Diagnosis not present

## 2021-03-31 DIAGNOSIS — J3089 Other allergic rhinitis: Secondary | ICD-10-CM | POA: Diagnosis not present

## 2021-06-09 DIAGNOSIS — Z9189 Other specified personal risk factors, not elsewhere classified: Secondary | ICD-10-CM | POA: Diagnosis not present

## 2021-06-09 DIAGNOSIS — Z1152 Encounter for screening for COVID-19: Secondary | ICD-10-CM | POA: Diagnosis not present

## 2021-07-25 DIAGNOSIS — Z7251 High risk heterosexual behavior: Secondary | ICD-10-CM | POA: Diagnosis not present

## 2021-07-25 DIAGNOSIS — Z79899 Other long term (current) drug therapy: Secondary | ICD-10-CM | POA: Diagnosis not present

## 2021-07-25 DIAGNOSIS — E785 Hyperlipidemia, unspecified: Secondary | ICD-10-CM | POA: Diagnosis not present

## 2021-07-25 DIAGNOSIS — R7303 Prediabetes: Secondary | ICD-10-CM | POA: Diagnosis not present

## 2021-07-25 DIAGNOSIS — E559 Vitamin D deficiency, unspecified: Secondary | ICD-10-CM | POA: Diagnosis not present

## 2021-08-25 DIAGNOSIS — M9902 Segmental and somatic dysfunction of thoracic region: Secondary | ICD-10-CM | POA: Diagnosis not present

## 2021-08-25 DIAGNOSIS — M9903 Segmental and somatic dysfunction of lumbar region: Secondary | ICD-10-CM | POA: Diagnosis not present

## 2021-08-25 DIAGNOSIS — M5383 Other specified dorsopathies, cervicothoracic region: Secondary | ICD-10-CM | POA: Diagnosis not present

## 2021-08-25 DIAGNOSIS — M9904 Segmental and somatic dysfunction of sacral region: Secondary | ICD-10-CM | POA: Diagnosis not present

## 2021-08-30 DIAGNOSIS — M9904 Segmental and somatic dysfunction of sacral region: Secondary | ICD-10-CM | POA: Diagnosis not present

## 2021-08-30 DIAGNOSIS — M9902 Segmental and somatic dysfunction of thoracic region: Secondary | ICD-10-CM | POA: Diagnosis not present

## 2021-08-30 DIAGNOSIS — M9903 Segmental and somatic dysfunction of lumbar region: Secondary | ICD-10-CM | POA: Diagnosis not present

## 2021-08-30 DIAGNOSIS — M5383 Other specified dorsopathies, cervicothoracic region: Secondary | ICD-10-CM | POA: Diagnosis not present

## 2021-09-20 DIAGNOSIS — M9904 Segmental and somatic dysfunction of sacral region: Secondary | ICD-10-CM | POA: Diagnosis not present

## 2021-09-20 DIAGNOSIS — M5383 Other specified dorsopathies, cervicothoracic region: Secondary | ICD-10-CM | POA: Diagnosis not present

## 2021-09-20 DIAGNOSIS — M9903 Segmental and somatic dysfunction of lumbar region: Secondary | ICD-10-CM | POA: Diagnosis not present

## 2021-09-20 DIAGNOSIS — M9902 Segmental and somatic dysfunction of thoracic region: Secondary | ICD-10-CM | POA: Diagnosis not present

## 2021-09-22 DIAGNOSIS — M9903 Segmental and somatic dysfunction of lumbar region: Secondary | ICD-10-CM | POA: Diagnosis not present

## 2021-09-22 DIAGNOSIS — M9902 Segmental and somatic dysfunction of thoracic region: Secondary | ICD-10-CM | POA: Diagnosis not present

## 2021-09-22 DIAGNOSIS — M9904 Segmental and somatic dysfunction of sacral region: Secondary | ICD-10-CM | POA: Diagnosis not present

## 2021-09-22 DIAGNOSIS — M5383 Other specified dorsopathies, cervicothoracic region: Secondary | ICD-10-CM | POA: Diagnosis not present

## 2021-09-27 DIAGNOSIS — M5383 Other specified dorsopathies, cervicothoracic region: Secondary | ICD-10-CM | POA: Diagnosis not present

## 2021-09-27 DIAGNOSIS — M9904 Segmental and somatic dysfunction of sacral region: Secondary | ICD-10-CM | POA: Diagnosis not present

## 2021-09-27 DIAGNOSIS — M9903 Segmental and somatic dysfunction of lumbar region: Secondary | ICD-10-CM | POA: Diagnosis not present

## 2021-09-27 DIAGNOSIS — M9902 Segmental and somatic dysfunction of thoracic region: Secondary | ICD-10-CM | POA: Diagnosis not present

## 2021-09-28 DIAGNOSIS — F419 Anxiety disorder, unspecified: Secondary | ICD-10-CM | POA: Diagnosis not present

## 2021-09-30 DIAGNOSIS — M9902 Segmental and somatic dysfunction of thoracic region: Secondary | ICD-10-CM | POA: Diagnosis not present

## 2021-09-30 DIAGNOSIS — M5383 Other specified dorsopathies, cervicothoracic region: Secondary | ICD-10-CM | POA: Diagnosis not present

## 2021-09-30 DIAGNOSIS — M9903 Segmental and somatic dysfunction of lumbar region: Secondary | ICD-10-CM | POA: Diagnosis not present

## 2021-09-30 DIAGNOSIS — M9904 Segmental and somatic dysfunction of sacral region: Secondary | ICD-10-CM | POA: Diagnosis not present

## 2021-10-04 DIAGNOSIS — M9904 Segmental and somatic dysfunction of sacral region: Secondary | ICD-10-CM | POA: Diagnosis not present

## 2021-10-04 DIAGNOSIS — M9903 Segmental and somatic dysfunction of lumbar region: Secondary | ICD-10-CM | POA: Diagnosis not present

## 2021-10-04 DIAGNOSIS — M5383 Other specified dorsopathies, cervicothoracic region: Secondary | ICD-10-CM | POA: Diagnosis not present

## 2021-10-04 DIAGNOSIS — M9902 Segmental and somatic dysfunction of thoracic region: Secondary | ICD-10-CM | POA: Diagnosis not present

## 2021-10-06 DIAGNOSIS — F4381 Prolonged grief disorder: Secondary | ICD-10-CM | POA: Diagnosis not present

## 2021-10-06 DIAGNOSIS — F4310 Post-traumatic stress disorder, unspecified: Secondary | ICD-10-CM | POA: Diagnosis not present

## 2021-10-07 DIAGNOSIS — M9904 Segmental and somatic dysfunction of sacral region: Secondary | ICD-10-CM | POA: Diagnosis not present

## 2021-10-07 DIAGNOSIS — M9902 Segmental and somatic dysfunction of thoracic region: Secondary | ICD-10-CM | POA: Diagnosis not present

## 2021-10-07 DIAGNOSIS — M9903 Segmental and somatic dysfunction of lumbar region: Secondary | ICD-10-CM | POA: Diagnosis not present

## 2021-10-07 DIAGNOSIS — M5383 Other specified dorsopathies, cervicothoracic region: Secondary | ICD-10-CM | POA: Diagnosis not present

## 2021-10-11 DIAGNOSIS — M9903 Segmental and somatic dysfunction of lumbar region: Secondary | ICD-10-CM | POA: Diagnosis not present

## 2021-10-11 DIAGNOSIS — M9902 Segmental and somatic dysfunction of thoracic region: Secondary | ICD-10-CM | POA: Diagnosis not present

## 2021-10-11 DIAGNOSIS — M5383 Other specified dorsopathies, cervicothoracic region: Secondary | ICD-10-CM | POA: Diagnosis not present

## 2021-10-11 DIAGNOSIS — M9904 Segmental and somatic dysfunction of sacral region: Secondary | ICD-10-CM | POA: Diagnosis not present

## 2021-10-13 DIAGNOSIS — F4381 Prolonged grief disorder: Secondary | ICD-10-CM | POA: Diagnosis not present

## 2021-10-13 DIAGNOSIS — F4310 Post-traumatic stress disorder, unspecified: Secondary | ICD-10-CM | POA: Diagnosis not present

## 2021-10-14 DIAGNOSIS — M9902 Segmental and somatic dysfunction of thoracic region: Secondary | ICD-10-CM | POA: Diagnosis not present

## 2021-10-14 DIAGNOSIS — M9903 Segmental and somatic dysfunction of lumbar region: Secondary | ICD-10-CM | POA: Diagnosis not present

## 2021-10-14 DIAGNOSIS — M5383 Other specified dorsopathies, cervicothoracic region: Secondary | ICD-10-CM | POA: Diagnosis not present

## 2021-10-14 DIAGNOSIS — M9904 Segmental and somatic dysfunction of sacral region: Secondary | ICD-10-CM | POA: Diagnosis not present

## 2021-10-21 ENCOUNTER — Other Ambulatory Visit: Payer: Self-pay

## 2021-10-21 ENCOUNTER — Ambulatory Visit (INDEPENDENT_AMBULATORY_CARE_PROVIDER_SITE_OTHER): Payer: Medicaid Other | Admitting: Orthopaedic Surgery

## 2021-10-21 ENCOUNTER — Ambulatory Visit (INDEPENDENT_AMBULATORY_CARE_PROVIDER_SITE_OTHER): Payer: Medicaid Other

## 2021-10-21 DIAGNOSIS — M545 Low back pain, unspecified: Secondary | ICD-10-CM

## 2021-10-21 DIAGNOSIS — M549 Dorsalgia, unspecified: Secondary | ICD-10-CM

## 2021-10-21 DIAGNOSIS — F4381 Prolonged grief disorder: Secondary | ICD-10-CM | POA: Diagnosis not present

## 2021-10-21 DIAGNOSIS — F4310 Post-traumatic stress disorder, unspecified: Secondary | ICD-10-CM | POA: Diagnosis not present

## 2021-10-21 DIAGNOSIS — G8929 Other chronic pain: Secondary | ICD-10-CM | POA: Diagnosis not present
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
## 2021-10-26 DIAGNOSIS — M9904 Segmental and somatic dysfunction of sacral region: Secondary | ICD-10-CM | POA: Diagnosis not present

## 2021-10-26 DIAGNOSIS — M5386 Other specified dorsopathies, lumbar region: Secondary | ICD-10-CM | POA: Diagnosis not present

## 2021-10-26 DIAGNOSIS — M9905 Segmental and somatic dysfunction of pelvic region: Secondary | ICD-10-CM | POA: Diagnosis not present

## 2021-10-26 DIAGNOSIS — M9903 Segmental and somatic dysfunction of lumbar region: Secondary | ICD-10-CM | POA: Diagnosis not present

## 2021-10-27 DIAGNOSIS — F4310 Post-traumatic stress disorder, unspecified: Secondary | ICD-10-CM | POA: Diagnosis not present

## 2021-10-27 DIAGNOSIS — F4381 Prolonged grief disorder: Secondary | ICD-10-CM | POA: Diagnosis not present

## 2021-10-28 DIAGNOSIS — E559 Vitamin D deficiency, unspecified: Secondary | ICD-10-CM | POA: Diagnosis not present

## 2021-10-28 DIAGNOSIS — E785 Hyperlipidemia, unspecified: Secondary | ICD-10-CM | POA: Diagnosis not present

## 2021-10-28 DIAGNOSIS — R7303 Prediabetes: Secondary | ICD-10-CM | POA: Diagnosis not present

## 2021-10-28 DIAGNOSIS — M9905 Segmental and somatic dysfunction of pelvic region: Secondary | ICD-10-CM | POA: Diagnosis not present

## 2021-10-28 DIAGNOSIS — M5386 Other specified dorsopathies, lumbar region: Secondary | ICD-10-CM | POA: Diagnosis not present

## 2021-10-28 DIAGNOSIS — M9904 Segmental and somatic dysfunction of sacral region: Secondary | ICD-10-CM | POA: Diagnosis not present

## 2021-10-28 DIAGNOSIS — M9903 Segmental and somatic dysfunction of lumbar region: Secondary | ICD-10-CM | POA: Diagnosis not present

## 2021-10-28 DIAGNOSIS — Z79899 Other long term (current) drug therapy: Secondary | ICD-10-CM | POA: Diagnosis not present

## 2021-10-30 ENCOUNTER — Ambulatory Visit
Admission: RE | Admit: 2021-10-30 | Discharge: 2021-10-30 | Disposition: A | Discharge status: Home / Self Care | Payer: Medicaid Other | Source: Ambulatory Visit | Attending: Orthopaedic Surgery | Admitting: Orthopaedic Surgery

## 2021-10-30 DIAGNOSIS — M545 Low back pain, unspecified: Secondary | ICD-10-CM

## 2021-11-03 ENCOUNTER — Ambulatory Visit: Payer: Medicaid Other | Admitting: Orthopaedic Surgery

## 2021-11-03 DIAGNOSIS — M9905 Segmental and somatic dysfunction of pelvic region: Secondary | ICD-10-CM | POA: Diagnosis not present

## 2021-11-03 DIAGNOSIS — F4381 Prolonged grief disorder: Secondary | ICD-10-CM | POA: Diagnosis not present

## 2021-11-03 DIAGNOSIS — M9904 Segmental and somatic dysfunction of sacral region: Secondary | ICD-10-CM | POA: Diagnosis not present

## 2021-11-03 DIAGNOSIS — M5386 Other specified dorsopathies, lumbar region: Secondary | ICD-10-CM | POA: Diagnosis not present

## 2021-11-03 DIAGNOSIS — F4310 Post-traumatic stress disorder, unspecified: Secondary | ICD-10-CM | POA: Diagnosis not present

## 2021-11-03 DIAGNOSIS — M9903 Segmental and somatic dysfunction of lumbar region: Secondary | ICD-10-CM | POA: Diagnosis not present

## 2021-11-10 ENCOUNTER — Encounter: Payer: Self-pay | Admitting: Physician Assistant

## 2021-11-10 ENCOUNTER — Ambulatory Visit (INDEPENDENT_AMBULATORY_CARE_PROVIDER_SITE_OTHER): Payer: Medicaid Other | Admitting: Physician Assistant

## 2021-11-10 DIAGNOSIS — M9904 Segmental and somatic dysfunction of sacral region: Secondary | ICD-10-CM | POA: Diagnosis not present

## 2021-11-10 DIAGNOSIS — G8929 Other chronic pain: Secondary | ICD-10-CM

## 2021-11-10 DIAGNOSIS — M9903 Segmental and somatic dysfunction of lumbar region: Secondary | ICD-10-CM | POA: Diagnosis not present

## 2021-11-10 DIAGNOSIS — M545 Low back pain, unspecified: Secondary | ICD-10-CM | POA: Diagnosis not present

## 2021-11-10 DIAGNOSIS — M5386 Other specified dorsopathies, lumbar region: Secondary | ICD-10-CM | POA: Diagnosis not present

## 2021-11-10 DIAGNOSIS — F4381 Prolonged grief disorder: Secondary | ICD-10-CM | POA: Diagnosis not present

## 2021-11-10 DIAGNOSIS — F4310 Post-traumatic stress disorder, unspecified: Secondary | ICD-10-CM | POA: Diagnosis not present

## 2021-11-10 DIAGNOSIS — M9905 Segmental and somatic dysfunction of pelvic region: Secondary | ICD-10-CM | POA: Diagnosis not present

## 2021-11-10 NOTE — Progress Notes (Signed)
?  HPI: Breanna Yates returns today to go over the MRI of her lumbar spine.  States she still has low back pain that comes and goes is worse with prolonged standing.  Describes a sharp pain and occasionally down the front of both legs.  She has been taking meloxicam.  She has also been seeing a chiropractor which has been beneficial. ?MRI lumbar spine images were reviewed with the patient.  Shows normal disc space throughout the lumbar spine.  No spinal or foraminal stenosis.  No significant arthritic changes.  Loss of lordotic curvature noted. ? ? ?Review of systems: See HPI otherwise negative ? ?Physical exam: General well-developed well-nourished female who ambulates without any assistive device.  Gets up and down out of a seated position easily. ?Lumbar spine she has full flexion is able to touch toes.  Limited extension.  Tenderness over the lumbar spinal column.  Paraspinous region lower lumbar and sacral nontender with palpation. ? ?Impression: Low back pain ? ?Plan: We will send her to formal therapy this is to gain a home exercise program for core strengthening and back exercises only.  She will follow-up with [place] as needed pain persist or becomes worse.  Questions encouraged and answered.  Copy of MRI report was given. ? ?

## 2021-11-17 DIAGNOSIS — M9903 Segmental and somatic dysfunction of lumbar region: Secondary | ICD-10-CM | POA: Diagnosis not present

## 2021-11-17 DIAGNOSIS — M5386 Other specified dorsopathies, lumbar region: Secondary | ICD-10-CM | POA: Diagnosis not present

## 2021-11-17 DIAGNOSIS — F4381 Prolonged grief disorder: Secondary | ICD-10-CM | POA: Diagnosis not present

## 2021-11-17 DIAGNOSIS — M9905 Segmental and somatic dysfunction of pelvic region: Secondary | ICD-10-CM | POA: Diagnosis not present

## 2021-11-17 DIAGNOSIS — M9904 Segmental and somatic dysfunction of sacral region: Secondary | ICD-10-CM | POA: Diagnosis not present

## 2021-11-17 DIAGNOSIS — F4310 Post-traumatic stress disorder, unspecified: Secondary | ICD-10-CM | POA: Diagnosis not present

## 2022-09-22 ENCOUNTER — Encounter: Payer: Self-pay | Admitting: Radiology

## 2023-12-19 ENCOUNTER — Ambulatory Visit (INDEPENDENT_AMBULATORY_CARE_PROVIDER_SITE_OTHER): Admitting: Podiatry

## 2023-12-19 ENCOUNTER — Encounter: Payer: Self-pay | Admitting: Podiatry

## 2023-12-19 DIAGNOSIS — L600 Ingrowing nail: Secondary | ICD-10-CM | POA: Diagnosis not present

## 2023-12-19 NOTE — Patient Instructions (Signed)

## 2023-12-19 NOTE — Progress Notes (Signed)
  Subjective:  Patient ID: Breanna Yates, female    DOB: January 20, 1986,   MRN: [id number]  Chief Complaint  Patient presents with   Nail Problem    Patient states that she had her right hallux removed a few years ago and that the hallux nail was not supposed to grow back but it did and would like for it to be removed again so it does not grow back.    38 y.o. female presents for concern as above.  . Denies any other pedal complaints. Denies n/v/f/c.   Past Medical History:  Diagnosis Date   Chlamydia 2008   CIN III (cervical intraepithelial neoplasia grade III) with severe dysplasia 09/18/2013   s/p Cervical Conization    Depression    mild per pt- overwhelmed and tired   GDM (gestational diabetes mellitus) 11/26/2015   Gonorrhea 10/05/2019   History of gestational diabetes mellitus (GDM)    Hypertension    PID (acute pelvic inflammatory disease) 10/04/2019    Objective:  Physical Exam: Vascular: DP/PT pulses 2/4 bilateral. CFT <3 seconds. Normal hair growth on digits. No edema.  Skin. No lacerations or abrasions bilateral feet. Right hallux nail about 1 cm of nail still present and regrowth with tenderness to palpation Musculoskeletal: MMT 5/5 bilateral lower extremities in DF, PF, Inversion and Eversion. Deceased ROM in DF of ankle joint.  Neurological: Sensation intact to light touch.   Assessment:   1. Ingrown right greater toenail      Plan:  Patient was evaluated and treated and all questions answered. Discussed ingrown toenails etiology and treatment options including procedure for removal vs conservative care.  Patient requesting removal of ingrown nail today. Procedure below.  Discussed procedure and post procedure care and patient expressed understanding.  Will follow-up in 2 weeks for nail check or sooner if any problems arise.    Procedure:  Procedure: total Nail Avulsion of right hallux nail  Surgeon: Jennefer Moats, DPM  Pre-op Dx: Ingrown toenail without  infection Post-op: Same  Place of Surgery: Office exam room.  Indications for surgery: Painful and ingrown toenail.    The patient is requesting removal of nail with  chemical matrixectomy. Risks and complications were discussed with the patient for which they understand and written consent was obtained. Under sterile conditions a total of 3 mL of  1% lidocaine  plain was infiltrated in a hallux block fashion. Once anesthetized, the skin was prepped in sterile fashion. A tourniquet was then applied. Next the entire hallux nail was removed.   Next phenol was then applied under standard conditions to permanently destroy the matrix and copiously irrigated. Silvadene was applied. A dry sterile dressing was applied. After application of the dressing the tourniquet was removed and there is found to be an immediate capillary refill time to the digit. The patient tolerated the procedure well without any complications. Post procedure instructions were discussed the patient for which he verbally understood. Follow-up in two weeks for nail check or sooner if any problems are to arise. Discussed signs/symptoms of infection and directed to call the office immediately should any occur or go directly to the emergency room. In the meantime, encouraged to call the office with any questions, concerns, changes symptoms.   Jennefer Moats, DPM

## 2024-01-03 ENCOUNTER — Encounter: Payer: Self-pay | Admitting: Podiatry

## 2024-01-03 ENCOUNTER — Ambulatory Visit (INDEPENDENT_AMBULATORY_CARE_PROVIDER_SITE_OTHER): Admitting: Podiatry

## 2024-01-03 DIAGNOSIS — L600 Ingrowing nail: Secondary | ICD-10-CM

## 2024-01-03 NOTE — Progress Notes (Signed)
  Subjective:  Patient ID: Breanna Yates, female    DOB: 10-01-1985,   MRN: [id number]  Chief Complaint  Patient presents with   Ingrown Toenail    RM#9 Follow up on ingrown nail removal patient states doing well no signs of infection patient is satisfied with outcome and healing process.    38 y.o. female presents for follow-up of right hallux nail removal. Doing well and has finsihed soaking  . Denies any other pedal complaints. Denies n/v/f/c.   Past Medical History:  Diagnosis Date   Chlamydia 2008   CIN III (cervical intraepithelial neoplasia grade III) with severe dysplasia 09/18/2013   s/p Cervical Conization    Depression    mild per pt- overwhelmed and tired   GDM (gestational diabetes mellitus) 11/26/2015   Gonorrhea 10/05/2019   History of gestational diabetes mellitus (GDM)    Hypertension    PID (acute pelvic inflammatory disease) 10/04/2019    Objective:  Physical Exam: Vascular: DP/PT pulses 2/4 bilateral. CFT <3 seconds. Normal hair growth on digits. No edema.  Skin. No lacerations or abrasions bilateral feet. Right hallux nail healing well.  Musculoskeletal: MMT 5/5 bilateral lower extremities in DF, PF, Inversion and Eversion. Deceased ROM in DF of ankle joint.  Neurological: Sensation intact to light touch.   Assessment:   1. Ingrown right greater toenail      Plan:  Patient was evaluated and treated and all questions answered. Toe was evaluated and appears to be healing well.  May discontinue soaks and neosporin.  Patient to follow-up as needed.    Jennefer Moats, DPM
